# Patient Record
Sex: Female | Born: 1995 | Race: Black or African American | Hispanic: No | Marital: Single | Smoking: Former smoker
Health system: Southern US, Community
[De-identification: ages and names within clinical notes are randomized; demographics above are authoritative.]

## PROBLEM LIST (undated history)

## (undated) DIAGNOSIS — J45909 Unspecified asthma, uncomplicated: Secondary | ICD-10-CM

## (undated) HISTORY — PX: TONSILLECTOMY: SUR1361

## (undated) HISTORY — PX: KNEE ARTHROSCOPY: SUR90

---
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
## 2014-02-24 ENCOUNTER — Ambulatory Visit: Payer: Medicaid Other | Attending: Family Medicine | Admitting: Physical Therapy

## 2014-02-24 DIAGNOSIS — M546 Pain in thoracic spine: Secondary | ICD-10-CM | POA: Diagnosis not present

## 2014-02-24 DIAGNOSIS — R5381 Other malaise: Secondary | ICD-10-CM | POA: Diagnosis not present

## 2014-02-24 DIAGNOSIS — M4120 Other idiopathic scoliosis, site unspecified: Secondary | ICD-10-CM | POA: Insufficient documentation

## 2014-02-24 DIAGNOSIS — M545 Low back pain, unspecified: Secondary | ICD-10-CM | POA: Diagnosis not present

## 2014-02-24 DIAGNOSIS — IMO0001 Reserved for inherently not codable concepts without codable children: Secondary | ICD-10-CM | POA: Diagnosis not present

## 2014-02-27 ENCOUNTER — Ambulatory Visit: Payer: Medicaid Other | Admitting: Physical Therapy

## 2014-02-27 DIAGNOSIS — IMO0001 Reserved for inherently not codable concepts without codable children: Secondary | ICD-10-CM | POA: Diagnosis not present

## 2014-03-03 ENCOUNTER — Ambulatory Visit: Payer: Medicaid Other | Admitting: Physical Therapy

## 2014-03-03 DIAGNOSIS — IMO0001 Reserved for inherently not codable concepts without codable children: Secondary | ICD-10-CM | POA: Diagnosis not present

## 2014-03-04 ENCOUNTER — Encounter: Payer: Medicaid Other | Admitting: *Deleted

## 2014-03-06 ENCOUNTER — Encounter: Payer: Medicaid Other | Admitting: Physical Therapy

## 2014-04-18 ENCOUNTER — Inpatient Hospital Stay (HOSPITAL_COMMUNITY)
Admission: AD | Admit: 2014-04-18 | Discharge: 2014-04-19 | Discharge status: Against Medical Advice | Payer: Medicaid Other | Attending: Obstetrics and Gynecology | Admitting: Obstetrics and Gynecology

## 2016-01-17 DIAGNOSIS — J45909 Unspecified asthma, uncomplicated: Secondary | ICD-10-CM | POA: Insufficient documentation

## 2016-01-17 DIAGNOSIS — Z79899 Other long term (current) drug therapy: Secondary | ICD-10-CM | POA: Insufficient documentation

## 2016-01-17 DIAGNOSIS — Z4801 Encounter for change or removal of surgical wound dressing: Secondary | ICD-10-CM | POA: Insufficient documentation

## 2016-01-17 DIAGNOSIS — Z791 Long term (current) use of non-steroidal anti-inflammatories (NSAID): Secondary | ICD-10-CM | POA: Insufficient documentation

## 2016-01-18 ENCOUNTER — Encounter (HOSPITAL_COMMUNITY): Payer: Self-pay | Admitting: Emergency Medicine

## 2016-01-18 ENCOUNTER — Emergency Department (HOSPITAL_COMMUNITY)
Admission: EM | Admit: 2016-01-18 | Discharge: 2016-01-18 | Disposition: A | Discharge status: Home / Self Care | Payer: Medicaid Other | Attending: Emergency Medicine | Admitting: Emergency Medicine

## 2016-01-18 DIAGNOSIS — Z5189 Encounter for other specified aftercare: Secondary | ICD-10-CM

## 2016-01-18 NOTE — ED Provider Notes (Signed)
CSN: [id number]     Arrival date & time 01/17/16  2359 History  By signing my name below, I, Lauren Reilly, attest that this documentation has been prepared under the direction and in the presence of Lauren Bale, MD. Electronically Signed: Bethel Reilly, ED Scribe. 01/18/2016. 12:45 AM   Chief Complaint  Patient presents with  . Wound Infection   The history is provided by the patient. No language interpreter was used.  Lauren Reilly is a 20 y.o. female who presents to the Emergency Department complaining of recurrent mild pain and bleeding at the site of a umbilical piercing with onset last night. Pt states that her bellybutton ring was caught on a car window when she reached in to grab something. The area was painful after the incident but had improved before returning last night. She had the piercing performed in February of this year and removed it before coming to the ED.   Past Medical History  Diagnosis Date  . Asthma    Past Surgical History  Procedure Laterality Date  . Knee arthroscopy    . Tonsillectomy     History reviewed. No pertinent family history. Social History  Substance Use Topics  . Smoking status: Never Smoker   . Smokeless tobacco: None  . Alcohol Use: No   OB History    No data available     Review of Systems  Skin: Positive for wound.  All other systems reviewed and are negative.  Allergies  Pollen extract  Home Medications   Prior to Admission medications   Medication Sig Start Date End Date Taking? Authorizing Provider  albuterol (PROVENTIL HFA;VENTOLIN HFA) 108 (90 BASE) MCG/ACT inhaler Inhale 2 puffs into the lungs every 6 (six) hours as needed. For shortness of breath    Historical Provider, MD  ibuprofen (ADVIL,MOTRIN) 200 MG tablet Take 600 mg by mouth daily as needed. For menstrual pain    Historical Provider, MD  naproxen (NAPROSYN) 500 MG tablet Take 1 tablet (500 mg total) by mouth 2 (two) times daily with a meal. 11/10/13    Felicie Morn, NP  Norgestimate-Ethinyl Estradiol Triphasic (ORTHO TRI-CYCLEN, 28,) 0.18/0.215/0.25 MG-35 MCG tablet Take 1 tablet by mouth every evening.    Historical Provider, MD   BP 115/68 mmHg  Pulse 72  Temp(Src) 98.3 F (36.8 C) (Oral)  Resp 20  Ht 5' 1.5" (1.562 m)  Wt 150 lb (68.04 kg)  BMI 27.89 kg/m2  SpO2 100%  LMP 01/11/2016 Physical Exam  Constitutional: She is oriented to person, place, and time. She appears well-developed and well-nourished.  HENT:  Head: Normocephalic and atraumatic.  Eyes: Conjunctivae and EOM are normal. Pupils are equal, round, and reactive to light.  Neck: Normal range of motion and phonation normal. Neck supple.  Cardiovascular: Normal rate and regular rhythm.   Pulmonary/Chest: Effort normal and breath sounds normal. She exhibits no tenderness.  Abdominal: Soft. She exhibits no distension. There is no tenderness. There is no guarding.  2 small punctures consistent with recent piercing at the area just above the umbilicus  Very mild induration at each of the punctures  Musculoskeletal: Normal range of motion.  Neurological: She is alert and oriented to person, place, and time. She exhibits normal muscle tone.  Skin: Skin is warm and dry.  Psychiatric: She has a normal mood and affect. Her behavior is normal. Judgment and thought content normal.  Nursing note and vitals reviewed.   ED Course  Procedures (including critical care time)  Medications -  No data to display  Patient Vitals for the past 24 hrs:  BP Temp Temp src Pulse Resp SpO2 Height Weight  01/18/16 0014 115/68 mmHg 98.3 F (36.8 C) Oral 72 20 100 % 5' 1.5" (1.562 m) 150 lb (68.04 kg)    At discharge- Reevaluation with update and discussion. After initial assessment and treatment, an updated evaluation reveals no change in clinical status. Findings discussed with patient and mother, all questions were answered. Lauren Reilly L    DIAGNOSTIC STUDIES: Oxygen Saturation is  100% on RA,  normal by my interpretation.    COORDINATION OF CARE: 12:34 AM Discussed treatment plan with pt at bedside and pt agreed to plan.  Labs Review Labs Reviewed - No data to display  Imaging Review No results found.    EKG Interpretation None      MDM   Final diagnoses:  Visit for wound check    Wound from piercing, periumbilical. Mild inflammatory change associated with the punctures. This appears primarily to be a inflammatory process, and there is no indication for cellulitis, abscess or systemic illness. Patient cannot replace her piercing at this time. She was somewhat distraught about that, but seem to understand the importance of avoiding putting the piercing back in.  Nursing Notes Reviewed/ Care Coordinated Applicable Imaging Reviewed Interpretation of Laboratory Data incorporated into ED treatment  The patient appears reasonably screened and/or stabilized for discharge and I doubt any other medical condition or other [facility] requiring further screening, evaluation, or treatment in the ED at this time prior to discharge.  Plan: Home Medications- none; Home Treatments- moist compress 3 times a day until improved; return here if the recommended treatment, does not improve the symptoms; Recommended follow up- PCP, when necessary   I personally performed the services described in this documentation, which was scribed in my presence. The recorded information has been reviewed and is accurate.    Lauren BaleElliott Muhanad Torosyan, MD 01/18/16 [phone]

## 2016-01-18 NOTE — Discharge Instructions (Signed)
The piercing wounds are inflamed from the injury. They should heal with time and treatments of warm compresses 3 times a day for 3- minutes. Take Tylenol or Motrin for pain. Return here or see your doctor for problems,

## 2016-01-18 NOTE — ED Notes (Signed)
Pt states she has been bleeding at bellybutton ring insertion site since she accidentally hit it.

## 2016-04-18 ENCOUNTER — Emergency Department (HOSPITAL_COMMUNITY)
Admission: EM | Admit: 2016-04-18 | Discharge: 2016-04-19 | Disposition: A | Discharge status: Other Acute Inpatient Hospital | Payer: Medicaid Other | Attending: Emergency Medicine | Admitting: Emergency Medicine

## 2016-04-18 ENCOUNTER — Encounter (HOSPITAL_COMMUNITY): Payer: Self-pay | Admitting: *Deleted

## 2016-04-18 DIAGNOSIS — F322 Major depressive disorder, single episode, severe without psychotic features: Secondary | ICD-10-CM | POA: Diagnosis present

## 2016-04-18 DIAGNOSIS — J45909 Unspecified asthma, uncomplicated: Secondary | ICD-10-CM | POA: Insufficient documentation

## 2016-04-18 DIAGNOSIS — F1290 Cannabis use, unspecified, uncomplicated: Secondary | ICD-10-CM | POA: Insufficient documentation

## 2016-04-18 DIAGNOSIS — R45851 Suicidal ideations: Secondary | ICD-10-CM

## 2016-04-18 DIAGNOSIS — Z7951 Long term (current) use of inhaled steroids: Secondary | ICD-10-CM | POA: Insufficient documentation

## 2016-04-18 DIAGNOSIS — Z79899 Other long term (current) drug therapy: Secondary | ICD-10-CM | POA: Insufficient documentation

## 2016-04-18 LAB — CBC
HEMATOCRIT: 35.2 % — AB (ref 36.0–46.0)
Hemoglobin: 11.7 g/dL — ABNORMAL LOW (ref 12.0–15.0)
MCH: 27.7 pg (ref 26.0–34.0)
MCHC: 33.2 g/dL (ref 30.0–36.0)
MCV: 83.2 fL (ref 78.0–100.0)
Platelets: 243 10*3/uL (ref 150–400)
RBC: 4.23 MIL/uL (ref 3.87–5.11)
RDW: 12.8 % (ref 11.5–15.5)
WBC: 9.3 10*3/uL (ref 4.0–10.5)

## 2016-04-18 LAB — COMPREHENSIVE METABOLIC PANEL
ALK PHOS: 68 U/L (ref 38–126)
ALT: 36 U/L (ref 14–54)
AST: 33 U/L (ref 15–41)
Albumin: 4.9 g/dL (ref 3.5–5.0)
Anion gap: 11 (ref 5–15)
BUN: 14 mg/dL (ref 6–20)
CALCIUM: 9.4 mg/dL (ref 8.9–10.3)
CHLORIDE: 105 mmol/L (ref 101–111)
CO2: 23 mmol/L (ref 22–32)
CREATININE: 0.8 mg/dL (ref 0.44–1.00)
GFR calc Af Amer: >60 mL/min (ref >60)
Glucose, Bld: 79 mg/dL (ref 65–99)
Potassium: 3.4 mmol/L — ABNORMAL LOW (ref 3.5–5.1)
Sodium: 139 mmol/L (ref 135–145)
Total Bilirubin: 0.6 mg/dL (ref 0.3–1.2)
Total Protein: 8.5 g/dL — ABNORMAL HIGH (ref 6.5–8.1)

## 2016-04-18 LAB — RAPID URINE DRUG SCREEN, HOSP PERFORMED
Amphetamines: NOT DETECTED
BARBITURATES: NOT DETECTED
Benzodiazepines: NOT DETECTED
Cocaine: NOT DETECTED
Opiates: NOT DETECTED
Tetrahydrocannabinol: POSITIVE — AB

## 2016-04-18 LAB — ETHANOL
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
## 2016-04-19 ENCOUNTER — Inpatient Hospital Stay (HOSPITAL_COMMUNITY)
Admission: AD | Admit: 2016-04-19 | Discharge: 2016-04-28 | DRG: 885 | Disposition: A | Discharge status: Home / Self Care | Payer: No Typology Code available for payment source | Source: Intra-hospital | Attending: Psychiatry | Admitting: Psychiatry

## 2016-04-19 ENCOUNTER — Encounter (HOSPITAL_COMMUNITY): Payer: Self-pay

## 2016-04-19 DIAGNOSIS — G4700 Insomnia, unspecified: Secondary | ICD-10-CM | POA: Diagnosis present

## 2016-04-19 DIAGNOSIS — F411 Generalized anxiety disorder: Secondary | ICD-10-CM | POA: Diagnosis present

## 2016-04-19 DIAGNOSIS — J45909 Unspecified asthma, uncomplicated: Secondary | ICD-10-CM | POA: Diagnosis present

## 2016-04-19 DIAGNOSIS — F332 Major depressive disorder, recurrent severe without psychotic features: Principal | ICD-10-CM | POA: Diagnosis present

## 2016-04-19 DIAGNOSIS — R45851 Suicidal ideations: Secondary | ICD-10-CM | POA: Diagnosis present

## 2016-04-19 DIAGNOSIS — F322 Major depressive disorder, single episode, severe without psychotic features: Secondary | ICD-10-CM | POA: Diagnosis present

## 2016-04-19 DIAGNOSIS — F410 Panic disorder [episodic paroxysmal anxiety] without agoraphobia: Secondary | ICD-10-CM | POA: Diagnosis present

## 2016-04-19 LAB — I-STAT BETA HCG BLOOD, ED (MC, WL, AP ONLY)

## 2016-04-19 LAB — SALICYLATE LEVEL

## 2016-04-19 LAB — ACETAMINOPHEN LEVEL

## 2016-04-19 MED ORDER — FLUOXETINE HCL 10 MG PO CAPS
10.0000 mg | ORAL_CAPSULE | Freq: Every day | ORAL | Status: DC
  Administered 2016-04-19: 10 mg via ORAL
  Filled 2016-04-19: qty 1

## 2016-04-19 MED ORDER — ALUM & MAG HYDROXIDE-SIMETH 200-200-20 MG/5ML PO SUSP: 30.0000 mL | ORAL | Status: DC | PRN

## 2016-04-19 MED ORDER — HYDROXYZINE HCL 25 MG PO TABS: 25.0000 mg | ORAL_TABLET | Freq: Three times a day (TID) | ORAL | Status: DC | PRN

## 2016-04-19 MED ORDER — MAGNESIUM HYDROXIDE 400 MG/5ML PO SUSP: 30.0000 mL | Freq: Every day | ORAL | Status: DC | PRN

## 2016-04-19 MED ORDER — ACETAMINOPHEN 325 MG PO TABS
650.0000 mg | ORAL_TABLET | Freq: Four times a day (QID) | ORAL | Status: DC | PRN
  Administered 2016-04-24 – 2016-04-28 (×3): 650 mg via ORAL
  Filled 2016-04-19 (×3): qty 2

## 2016-04-19 MED ORDER — TRAZODONE HCL 50 MG PO TABS: 50.0000 mg | ORAL_TABLET | Freq: Every day | ORAL | Status: DC

## 2016-04-19 NOTE — BH Assessment (Signed)
Consulted with Maryjean Mornharles Kober, PA-C who recommends overnight observation with a re-evaluation in the morning.   Davina PokeJoVea Shadeed Colberg, LCSW Therapeutic Triage Specialist [facility] 04/19/2016 3:59 AM
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
## 2016-04-19 NOTE — Tx Team (Signed)
Initial Interdisciplinary Treatment Plan   PATIENT STRESSORS: Educational concerns Marital or family conflict   PATIENT STRENGTHS: Wellsite geologistCommunication skills General fund of knowledge Motivation for treatment/growth Physical Health   PROBLEM LIST: Problem List/Patient Goals Date to be addressed Date deferred Reason deferred Estimated date of resolution  Depression 04/19/16     Suicidal ideation 04/19/16     History of trauma 04/19/16     "I would like to be happy with myself" 04/19/16     "Finding a better outlet then screaming and yelling" 04/19/16                              DISCHARGE CRITERIA:  Improved stabilization in mood, thinking, and/or behavior Verbal commitment to aftercare and medication compliance  PRELIMINARY DISCHARGE PLAN: Outpatient therapy Medication management  PATIENT/FAMIILY INVOLVEMENT: This treatment plan has been presented to and reviewed with the patient, [employer] Timken CompanyShaniece N Costlow.  The patient and family have been given the opportunity to ask questions and make suggestions.  Norm ParcelHeather V Meyer Dockery 04/19/2016, 5:41 PM
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
## 2016-04-19 NOTE — ED Notes (Signed)
Bed: WA27 Expected date:  Expected time:  Means of arrival:  Comments: Hold for room  4

## 2016-04-19 NOTE — Progress Notes (Signed)
D:  Pt is on unit in room.  Pt is animated, friendly and joking.  Pt has complaint for skin rash and mom has brought in sme ointment. A:  Pt asking to go to storage unit and retreive her car keys, cell phone and some cash to give her mom.  Explained that there is no order for ointment but will pass on to day shift nurse in am. R: Pt verbalizes understanding.  Pt remains safe on unit.

## 2016-04-19 NOTE — ED Notes (Signed)
Bed: WA04 Expected date:  Expected time:  Means of arrival:  Comments: 

## 2016-04-19 NOTE — Progress Notes (Signed)
Not[facility]ill[facility]bedLindsay, [facility] that we needed orders on Areya.  She notified Renata Capriceonrad NP requesting orders.
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
## 2016-04-19 NOTE — BH Assessment (Signed)
[facility]     04/19/16: Patient meets criteria inpatient criteria [facility] 400 hall recommended. 407-2 paperwork completed.

## 2016-04-20 DIAGNOSIS — F332 Major depressive disorder, recurrent severe without psychotic features: Principal | ICD-10-CM

## 2016-04-20 LAB — TSH: TSH: 0.805 u[IU]/mL (ref 0.350–4.500)

## 2016-04-20 MED ORDER — HYDROXYZINE HCL 25 MG PO TABS
25.0000 mg | ORAL_TABLET | Freq: Four times a day (QID) | ORAL | Status: DC | PRN
  Administered 2016-04-22 – 2016-04-28 (×8): 25 mg via ORAL
  Filled 2016-04-20: qty 1
  Filled 2016-04-20: qty 10
  Filled 2016-04-20 (×7): qty 1

## 2016-04-20 MED ORDER — CITALOPRAM HYDROBROMIDE 10 MG PO TABS
10.0000 mg | ORAL_TABLET | Freq: Every day | ORAL | Status: DC
  Administered 2016-04-20 – 2016-04-25 (×6): 10 mg via ORAL
  Filled 2016-04-20 (×9): qty 1

## 2016-04-20 MED ORDER — CLOTRIMAZOLE-BETAMETHASONE 1-0.05 % EX CREA
TOPICAL_CREAM | Freq: Two times a day (BID) | CUTANEOUS | Status: DC
  Administered 2016-04-20 – 2016-04-22 (×4): via TOPICAL
  Administered 2016-04-23: 1 via TOPICAL
  Administered 2016-04-23 – 2016-04-28 (×7): via TOPICAL

## 2016-04-20 NOTE — [facility] Group Notes (Signed)
   [facility]  04/20/2016  8:45 AM   Participation Quality: Alert, Appropriate and Oriented  Mood/Affect: Appropriate  Depression Rating: 3  Anxiety Rating: reports little to no anxiety   Thoughts of Suicide: Pt denies SI/HI  Will you contract for safety? Yes  Current AVH: Pt denies  Plan for Discharge/Comments: Pt attended discharge planning group and actively participated in group. CSW provided pt with today's workbook. Patient is unsure if she will have a place to live at discharge. She would like a referral for outpatient services.   Transportation Means: CSW continuing to assess  Supports: No supports mentioned at this time  Samuella BruinKristin Seylah Wernert, MSW, Johnson & JohnsonLCSW Clinical Social Worker [facility] [phone]

## 2016-04-20 NOTE — [facility] Suicide Risk Assessment (Signed)
[facility]   Nursing information obtained from:  Patient Demographic factors:  Adolescent or young adult, Unemployed Current Mental Status:  NA Loss Factors:  Decrease in vocational status, Loss of significant relationship, Financial problems / change in socioeconomic status Historical Factors:  Impulsivity Risk Reduction Factors:  Sense of responsibility to family  Total Time spent with patient: 45 minutes Principal Problem: MDD  Diagnosis:   Patient Active Problem List   Diagnosis Date Noted  . Severe major depression, single episode, without psychotic features (HCC) [F32.2] 04/19/2016  . MDD (major depressive disorder), recurrent severe, without psychosis (HCC) [F33.2] 04/19/2016     Continued Clinical Symptoms:  Alcohol Use Disorder Identification Test Final Score (AUDIT): 1 The "Alcohol Use Disorders Identification Test", Guidelines for Use in Primary Care, Second Edition.  World Science writerHealth Organization [facility](WHO). Score between 0-7:  no or low risk or alcohol related problems. Score between 8-15:  moderate risk of alcohol related problems. Score between 16-19:  high risk of alcohol related problems. Score 20 or above:  warrants further diagnostic evaluation for alcohol dependence and treatment.   CLINICAL FACTORS:  20 year old single female, presents with symptoms of depression, suicidal ideations, worsened by recent argument with boyfriend      Psychiatric Specialty Exam: Physical Exam  ROS  Blood pressure 103/59, pulse 90, temperature 98.5 F (36.9 C), temperature source Oral, resp. rate 16, height 5' 1.5" (1.562 m), weight 156 lb (70.761 kg), last menstrual period 03/19/2016, SpO2 100 %.Body mass index is 29 kg/(m^2).   see admit note MSE    COGNITIVE FEATURES THAT CONTRIBUTE TO RISK:  Closed-mindedness and Loss of executive function    SUICIDE RISK:   Moderate:  Frequent suicidal ideation with limited intensity, and duration, some specificity  in terms of plans, no associated intent, good self-control, limited dysphoria/symptomatology, some risk factors present, and identifiable protective factors, including available and accessible social support.  PLAN OF CARE: Patient will be admitted to inpatient psychiatric unit for stabilization and safety. Will provide and encourage milieu participation. Provide medication management and maked adjustments as needed.  Will follow daily.    I certify that inpatient services furnished can reasonably be expected to improve the patient's condition.   Nehemiah MassedOBOS, FERNANDO, MD 04/20/2016, 2:03 PM

## 2016-04-20 NOTE — Progress Notes (Signed)
Patient ID: Lauren Reilly, female   DOB: 05/07/1996, 20 y.o.   MRN: [id number]   Pt currently presents with a flat affect and labile behavior. Pt mood is labile, easily angered when patient requests are denied. Per self inventory, pt rates depression at a 1, hopelessness 0 and anxiety 0. Pt's daily goal is to "find a better way to let out my anger and frustration" and they intend to do so by "listen and pay close attention." Pt reports poor sleep, a good appetite, low energy and poor concentration. Pt reports itching patches of eczema, made worse by recent sweating.   Pt provided with medications per providers orders. Pt's labs and vitals were monitored throughout the day. Pt supported emotionally and encouraged to express concerns and questions. Pt educated on medications. PTA cream verified by pharmacist, se MAR.   Pt's safety ensured with 15 minute and environmental checks. Pt currently denies SI/HI and A/V hallucinations. Pt verbally agrees to seek staff if SI/HI or A/VH occurs and to consult with staff before acting on any harmful thoughts. Will continue POC.

## 2016-04-20 NOTE — Progress Notes (Signed)
D: Pt was in the dayroom upon initial approach.  Pt has appropriate affect and depressed mood.  Her goal today was "to find a better way to express myself when I get upset."  Pt reports she was talking to her aunt and she became more depressed earlier today.  Pt reports she started coloring and "it soothes me, it brought my depression down from a 6 to a 2."  Pt denies SI/HI, denies hallucinations, denies pain.  Pt has been visible in milieu interacting with peers and staff appropriately.     A: Introduced self to pt.  Met with pt and offered support and encouragement.  Actively listened to pt.   R: Pt verbally contracts for safety.  She reports she will inform staff of needs and concerns.  Will continue to monitor and assess.
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
## 2016-04-20 NOTE — Progress Notes (Signed)
Recreation Therapy Notes  Date: 07.05.2017 Time: 9:30am Location: [address] Group Room   Group Topic: Stress Management  Goal Area(s) Addresses:  Patient will actively participate in stress management techniques presented during session.   Behavioral Response: Engaged, Attentive   Intervention: Stress management techniques  Activity :  Deep Breathing and Tapping. LRT provided education, instruction and demonstration on practice of Deep Breathing and Tapping. Patient was asked to participate in technique introduced during session.   Education:  Stress Management, Discharge Planning.   Education Outcome: Acknowledges education  Clinical Observations/Feedback: Patient actively engaged in technique introduced, expressed no concerns and demonstrated ability to practice independently post d/c.   Lauren Reilly Lauren Reilly, LRT/CTRS        Lauren Reilly Reilly 04/20/2016 12:00 PM 
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
## 2016-04-21 MED ORDER — CLOTRIMAZOLE 1 % EX CREA
TOPICAL_CREAM | CUTANEOUS | Status: AC
  Administered 2016-04-21: 17:00:00
  Filled 2016-04-21: qty 15
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
## 2016-04-21 NOTE — [facility] Group Notes (Signed)
[facility] LCSW Group Therapy 04/21/2016 1:15 PM Type of Therapy: Group Therapy Participation Level: Active  Participation Quality: Attentive, Sharing and Supportive  Affect: Appropriate  Cognitive: Alert and Oriented  Insight: Developing/Improving and Engaged  Engagement in Therapy: Developing/Improving and Engaged  Modes of Intervention: Activity, Clarification, Confrontation, Discussion, Education, Exploration, Limit-setting, Orientation, Problem-solving, Rapport Building, Reality Testing, Socialization and Support  Summary of Progress/Problems: Patient was attentive and engaged with speaker from [employer]. Patient was attentive to speaker while they shared their story of dealing with mental health and overcoming it. Patient expressed interest in their programs and services and received information on their agency. Patient processed ways they can relate to the speaker.   Jasie Meleski, LCSW Clinical Social Worker [facility] [phone]   
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
## 2016-04-21 NOTE — B[facility] Group Notes (Signed)
Late Entry from 7/5:      [facility] 04/20/16  1:15 PM   Type of Therapy: Group Therapy  Participation Level: Did Not Participate as she was meeting with provider. Patient returned at the end of group.   Samuella BruinKristin Ludy Messamore, MSW, LCSW Clinical Social Worker [facility] [phone]

## 2016-04-22 NOTE — Plan of Care (Signed)
Problem: Activity: Goal: Sleeping patterns will improve Outcome: Progressing Pt slept 6.75 hours for the past two nights according to flowsheets.

## 2016-04-22 NOTE — Progress Notes (Signed)
D: Pt was in the dayroom upon initial approach.  Pt has appropriate affect and pleasant mood.  When asked about her day, pt states "it was okay, my auntie just left.  We had a good talk, but it was an emotional talk."  Pt reports "I did accomplish my goal today.  It was to build a better relationship with family."  Pt's mother also came to visit tonight.  She reports it was a positive visit and "she's concerned.  She wants family therapy."  Pt discussed how writing in her journal is a positive coping skill for her.  She reports she reflects on "words of wisdom" she has written in the back of her journal.  Pt denies SI/HI, denies hallucinations, denies pain.  Pt has been visible in milieu interacting with peers and staff appropriately.  She has been positively encouraging peers.  Pt attended evening karaoke group and sang two songs.  A: Met with pt and offered support and encouragement.  Actively listened to pt.  Positive coping skills encouraged and reinforced. R: Pt verbally contracts for safety.  She reports she will inform staff of needs and concerns.  Will continue to monitor and assess.

## 2016-04-22 NOTE — Progress Notes (Signed)
Recreation Therapy Notes  Date: 07.07.2017 Time: 9:30am Location: [address] Group Room   Group Topic: Stress Management  Goal Area(s) Addresses:  Patient will actively participate in stress management techniques presented during session.   Behavioral Response: Did not attend.   Trino Higinbotham L Pakou Rainbow, LRT/CTRS        Alleta Avery L 04/22/2016 11:45 AM 

## 2016-04-22 NOTE — [facility] Group Notes (Signed)
   [facility]  04/22/2016  8:45 AM   Participation Quality: Alert, Appropriate and Oriented  Mood/Affect: Appropriate  Depression Rating: reports little to no depression today  Anxiety Rating: reports mild anxiety  Thoughts of Suicide: Pt denies SI/HI  Will you contract for safety? Yes  Current AVH: Pt denies  Plan for Discharge/Comments: Pt attended discharge planning group and actively participated in group. CSW provided pt with today's workbook. Patient plans to return home with family to follow up with outpatient services.   Transportation Means: Pt reports access to transportation  Supports: No supports mentioned at this time  Lauren Reilly, MSW, Johnson & JohnsonLCSW Clinical Social Worker [facility] [phone]

## 2016-04-22 NOTE — Progress Notes (Signed)
Adult Psychoeducational Group Note  Date:  04/22/2016 Time:  9:50 PM  Group Topic/Focus:  Wrap-Up Group:   The focus of this group is to help patients review their daily goal of treatment and discuss progress on daily workbooks.  Participation Level:  Active  Participation Quality:  Appropriate  Affect:  Anxious  Cognitive:  Appropriate  Insight: Good  Engagement in Group:  Engaged  Modes of Intervention:  Activity  Additional Comments:  Patient rated her day a 3. Stated she did not have a good day because she had a panic attack. Goal is to find new coping mechanisms to not panic.  Natasha MeadKiara M Nash[place] Duquette 04/22/2016, 9:50 PM
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
## 2016-04-22 NOTE — Progress Notes (Signed)
D:  Patient's self inventory sheet, patient sleeps good, no sleep medication given.  Good appetite, normal energy level, good concentration.  Rated depression 1, denied hopeless and anxiety.  Denied withdrawals.  Denied SI.  Denied physial problems.  Denied pain.  Goal is to learn a new way to deal with everything.  Plans to try new activiies.  Patient stated she tries to repress thoughts/feelings which depress her.  Hard to talk about problems which upset her.  Does have discharge plans. A:  Medications administered per MD orders.  Emotional support and encouragement given patient. R:  Denied SI and HI, contracts for safety.  Denied A/V hallucinations.  Safety maintained with 15 minute checks.

## 2016-04-22 NOTE — BHH Suicide Risk Assessment (Signed)
BHH INPATIENT:  Family/Significant Other Suicide Prevention Education  Suicide Prevention Education:  Education Completed; mother [name] [phone],  (name of family member/significant other) has been identified by the patient as the family member/significant other with whom the patient will be residing, and identified as the person(s) who will aid the patient in the event of a mental health crisis (suicidal ideations/suicide attempt).  With written consent from the patient, the family member/significant other has been provided the following suicide prevention education, prior to the and/or following the discharge of the patient.  The suicide prevention education provided includes the following:  Suicide risk factors  Suicide prevention and interventions  National Suicide Hotline telephone number  [facility] assessment telephone number  [facility] Emergency Assistance 911  [facility] and/or Residential Mobile Crisis Unit telephone number  Request made of family/significant other to:  Remove weapons (e.g., guns, rifles, knives), all items previously/currently identified as safety concern.    Remove drugs/medications (over-the-counter, prescriptions, illicit drugs), all items previously/currently identified as a safety concern.  The family member/significant other verbalizes understanding of the suicide prevention education information provided.  The family member/significant other agrees to remove the items of safety concern listed above.  Lauren Reilly, Lauren Reilly 04/22/2016, 3:25 PM
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
## 2016-04-22 NOTE — Progress Notes (Signed)
  [facility] :  Will you be returning to the same living situation after discharge:  Yes,  patient plans to return home At discharge, do you have transportation home?: Yes,  family Do you have the ability to pay for your medications: Yes,  patient will be provided with prescriptions at discharge  Release of information consent forms completed and in the chart;  Patient's signature needed at discharge.  Patient to Follow up at: Follow-up Information    Follow up with [facility].   Why:  Referral for therapy and medication management services made on 7/6. Office staff should contact you on Tuesday 7/11 to schedule assessment. If you do not hear from staff by Wed. 7/12, call office directly.    Contact information:   [address], [address]  [place], [place]  [address]  Ph:  [phone]   (fax) [phone]      Please follow up.   Why:  Bring ID, social security card, and any proof of income to assessment.      Next level of care provider has access to [facility] Link:no  Safety Planning and Suicide Prevention discussed: Yes,  with patient and mother  Have you used any form of tobacco in the last 30 days? (Cigarettes, Smokeless Tobacco, Cigars, and/or Pipes): Yes  Has patient been referred to the Quitline?: Patient refused referral  Patient has been referred for addiction treatment: Yes  Anjoli Diemer, [place]boKristin L 04/22/2016, 4:27 PM

## 2016-04-22 NOTE — Plan of Care (Signed)
Problem: Education: Goal: Utilization of techniques to improve thought processes will improve Outcome: Progressing Nurse discussed depression/coping skills with patient.    

## 2016-04-22 NOTE — Progress Notes (Signed)
D: Pt was in the dayroom with visitors upon initial approach.  She introduced Clinical research associatewriter to visitors and then returned to her visit.  Pt has appropriate affect and anxious mood.  When asked what her goal is, pt states "to use the skills I'm learning."  Pt reports "I can't continue to hold things in and bottle them up.  That's what got me here."  Pt reports having a good visit with her aunt and "god mommy."  Reports having a hard time earlier today because "I thought I was going to have to discharge to my mom's house and my grandma is there."  Pt reports feeling this would be an environment of "toxicity" because she and her grandmother do not get along.  Pt reports she may be discharging Monday and other aftercare plans are being considered.  Pt denies SI/HI, denies hallucinations, denies pain.  Pt has been visible in milieu interacting with peers and staff appropriately.  Pt attended evening group.   A: Actively listened to pt and offered support and encouragement.  Positive coping skills encouraged and reinforced.   R: Pt discussed some of the coping skills she has including: "tapping, deep-breathing, writing in my journal, coloring."  Pt verbally contracts for safety.  Reports she will inform staff of needs and concerns.  Will continue to monitor and assess.

## 2016-04-23 NOTE — Progress Notes (Signed)
Writer has observed patient up the dayroom this evening interacting appropriately with peers. She attended group this evening and was later seen in the dayroom coloring. She denies si/hi/a/v hallucinations. Support given and safety maintained on unit with 15 min checks,

## 2016-04-23 NOTE — [facility] Group Notes (Addendum)
The focus of this group is to educate the patient on the purpose and policies of crisis stabilization and provide a format to answer questions about their admission.  The group details unit policies and expectations of patients while admitted.  Patient attended 0900 nurse education orientation group this morning.  Patient actively participated and had appropriate affect.  Patient was alert.  Patent had appropriate insight and appropriate engagement.  Today patient will work on 3 goals for discharge.   

## 2016-04-23 NOTE — [facility] Counselor (Signed)
[facility] LCSW Group Therapy Note  04/23/2016 10:15 - 11:15 AM  Type of Therapy and Topic:  Group Therapy: Avoiding Self-Sabotaging and Enabling Behaviors  Participation Level:  Active  Participation Quality:  Appropriate  Affect:  Appropriate  Cognitive:  Alert and Oriented  Insight:  Developing/Improving  Engagement in Therapy:  Developing/Improving   Therapeutic models used Cognitive Behavioral Therapy Person-Centered Therapy Motivational Interviewing   Summary of Patient Progress: The main focus of today's process group was for the patient to identify ways in which they have in the past sabotaged their own recovery. Motivational Interviewing was utilized to ask the group members what they get out of their self sabotaging behavior(s), and what reasons they may have for wanting to change. The Stages of Change were explained using a handout, and patients identified where they currently are with regard to stages of change. Yamilette reports she has made a decision to change and realizes she will need to work on spending less time in isolation and negative self talk.    Carney Bernatherine C Seth Higginbotham, LCSW
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
## 2016-04-23 NOTE — Progress Notes (Signed)
Adult Psychoeducational Group Note  Date:  04/23/2016 Time:  10:17 PM  Group Topic/Focus:  Wrap-Up Group:   The focus of this group is to help patients review their daily goal of treatment and discuss progress on daily workbooks.  Participation Level:  Active  Participation Quality:  Appropriate and Supportive  Affect:  Anxious and Appropriate  Cognitive:  Alert, Appropriate and Oriented  Insight: Appropriate  Engagement in Group:  Engaged and Supportive  Modes of Intervention:  Discussion and Support  Additional Comments:  Patient articulated that her day was a 9. She stayed positive and participated in groups and activities during the day.  She decided to use coloring, good communication and meditation as her coping skills.   Rayne Loiseau W Meilyn Heindl 04/23/2016, 10:17 PM

## 2016-04-24 NOTE — Plan of Care (Signed)
Problem: Education: Goal: Knowledge of the prescribed therapeutic regimen will improve Outcome: Progressing Nurse discussed depression/anxiety/coping skills with patient.    

## 2016-04-24 NOTE — Progress Notes (Signed)
Close Observation Note: 1715  Patient has been sleeping in chair in the dayroom with close observation.  No signs/symptoms of pain/distress noted on patient's face/body movements.  Respirations even and unlabored. Close observation continues per MD order.

## 2016-04-24 NOTE — Progress Notes (Addendum)
Close observation Note: 1445  Patient very upset over discharge, thought she would be discharged this afternoon but will be discharged tomorrow morning (Monday 04/25/2016 early) per NP.  Patient told nurse that she was ready to be discharged, does not want to return to grandmother's home, mother is going out of town.  Homeless shelter information was given to patient to call for assistance.  Patient has not made any of these calls as of this time.  Patient told nurse that she was very upset and had rubbed her hand against the side of her furniture trying to hurt herself.  NP informed and nurse was told to put patient on close obs, patient change into scrubs, all belongings in her room were placed in lockers which will be returned to patient at discharge.  [name]  Patient will be sleeping in quiet room tonight.  Respirations even and unlabored.  No signs/symptoms of pain/distress noted on patient's face/body movements.  Close obs to continue per MD order for safety.

## 2016-04-24 NOTE — Progress Notes (Signed)
Close Observation Note: 1845  Patient has been sleeping on chair in dayroom.  Respirations even and unlabored.  No signs/symptoms of pain/distress noted on patient's face/body movements.  Close observation continues for patient's safety per MD order.

## 2016-04-24 NOTE — [facility] Group Notes (Signed)
[facility] LCSW Group Therapy  04/24/2016 10:00 AM  Type of Therapy:  Group Therapy and Activity to Identify signs of Improvement or Decompensation   Participation Level:  Did Not Attend; patient was seen in her room in bed and encouraged to attend yet was a no show.   Carney Bernatherine C Harrill, LCSW

## 2016-04-24 NOTE — Progress Notes (Signed)
D: Pt was in the hallway upon initial approach.  Pt has anxious, depressed affect and mood.  She reports feeling "a lot better than earlier."  Pt denies SI/HI, denies hallucinations, denies pain.  Pt has been visible in milieu interacting with peers and staff appropriately.  Pt attended evening group.   A: Actively listened to pt and offered support and encouragement.  PRN medication administered for anxiety.  On-call provider notified that pt verbally contracts for safety tonight.  Close observation discontinued per on-call provider order.  Pt's belongings are to remain in locker and pt is to remain in paper scrubs per on-call provider order.   R: Pt is compliant with medication.  Pt verbally contracts for safety.  She reports she will inform staff of needs and concerns.  Will continue to monitor and assess.

## 2016-04-24 NOTE — [facility] Group Notes (Signed)
The focus of this group is to educate the patient on the purpose and policies of crisis stabilization and provide a format to answer questions about their admission.  The group details unit policies and expectations of patients while admitted.  Patient did not attend 0900 nurse education orientation group this morning.  Patient talked to MD.

## 2016-04-24 NOTE — Progress Notes (Signed)
D:  Patient's self inventory sheet, patient sleeps good, no sleep medication given.  Good appetite, normal energy level, good concentration.  Rated depression 1, denied hopeless, anxiety 3.  Denied withdrawals.  Denied SI.  Denied physical problems. Denied pain.  Goal is to work on positive thinking and change mind set.  Plans to write 10 positive things about herself.  Does have discharge plans. A:  Medications administered per MD order.  Emotional support and encouragement given patient. R:  Denied SI and HI, contracts for safety.  Denied A/V hallucinations.  Safety maintained with 15 minute checks.
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
## 2016-04-24 NOTE — [facility] Counselor (Signed)
CSW and Berneta Levins NP met with patient's mother in [facility] Conference room after verifying written permission from patient and obtaining verbal permission. Mother presents with similar concern as patient who was to go to aunt's home today at discharge. Due to aunts unexpected business trip she cannot go there today. Mother believes as does patient it would not be in patient's best interest to DC home today. Both patient and mother were given a list of shelters in area and encouraged to contact them. Mother states she will make some calls; patient remained in bed during 10 group therapy session.Patient was provided of shelter contacts and encouraged to make calls at 11:40 AM  Lauren Pigeon, LCSW

## 2016-04-25 MED ORDER — CITALOPRAM HYDROBROMIDE 20 MG PO TABS
20.0000 mg | ORAL_TABLET | Freq: Every day | ORAL | Status: DC
  Administered 2016-04-26 – 2016-04-28 (×3): 20 mg via ORAL
  Filled 2016-04-25 (×4): qty 1
  Filled 2016-04-25: qty 7

## 2016-04-25 NOTE — Progress Notes (Signed)
Recreation Therapy Notes   Date: 07.10.2017 Time: 9:30am  Location: [address] Group Room   Group Topic: Stress Management  Goal Area(s) Addresses:  Patient will actively participate in stress management techniques presented during session.   Behavioral Response: Did not attend.   Lauren Reilly, LRT/CTRS        Tayden Duran L 04/25/2016 10:22 AM 
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
## 2016-04-25 NOTE — Progress Notes (Signed)
The focus of this group is to help patients review their daily goal of treatment and discuss progress on daily workbooks.   Patient attended group and participated. Patient states that her goal for today was to maintain positive attitude. She states that her mom, aunt and godmother came to visit her and they kept her in a good mood. She states that she rated her day as a 8 out of 10

## 2016-04-25 NOTE — Progress Notes (Signed)
Psychoeducational Group Note  Date:  04/25/2016 Time:  1015  Group Topic/Focus:  Self Care:   The focus of this group is to help patients understand the importance of self-care in order to improve or restore emotional, physical, spiritual, interpersonal, and financial health.  Participation Level: Did Not Attend  Participation Quality:  Not Applicable  Affect:  Not Applicable  Cognitive:  Not Applicable  Insight:  Not Applicable  Engagement in Group: Not Applicable  Additional Comments:    Maretta Losli, Ebone Alcivar Patience 04/25/2016, 3:24 PM
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
## 2016-04-25 NOTE — Progress Notes (Signed)
D: Pt presents animated on approach. Pt rates decreasing depression 4/10. Hopelessness 2/10. Anxiety 3/10. Pt denies suicidal thoughts. Pt verbalized concerns about discharging home. Pt stated that her family is tying to find her a safe place to discharge to. Pt stated that she cannot return back to her grandmother's house because she will fall back into depression.  A: Medications reviewed with pt. Medications administered as ordered per MD. Verbal support provided. Pt encouraged to attend groups. 15 minute checks performed for safety.  R: Pt receptive to tx.

## 2016-04-26 NOTE — Progress Notes (Signed)
MD requested nurse to return 4 bags of belongings that were removed from her room when she was suicidal.  Patient has improved and nurse returned 4 bags of belongings to patient.  Patient was very happy and smiling. Patient stated she is feeling much better now, denied SI thoughts.  Respirations even and unlabored.  No signs/symptoms of pain/distress noted on patient's face/body movements.  Safety maintained with 15 minute checks.

## 2016-04-26 NOTE — Progress Notes (Signed)
D: Pt was in the dayroom upon initial approach.  Pt has anxious affect and mood.  Her goal is to "be safe."  She reports she is working on her discharge plan.  Rep[facility], I feel like it's a good thing, it's what I need but it does make me anxious because I've never been there and I don't know anyone there."  Pt denies SI/HI, denies hallucinations, denies pain.  Pt has been visible in milieu interacting with peers and staff appropriately.  Pt attended evening group.   A: Actively listened to pt and offered support and encouragement.  PRN medication administered for anxiety. R: Pt is compliant with medication.  Pt verbally contracts for safety.  Will continue to monitor and assess.

## 2016-04-26 NOTE — Progress Notes (Signed)
Patient ID: Lauren Reilly, female   DOB: Feb 20, 1996, 20 y.o.   MRN: [id number] D   ---   Pt. Agrees to contract for safety and  Denies pain at this time.   She has minimal conversation with staff and minimal eye contact.m  She does smile on approach and accepts medications as asked.  She did not attend group this AM and has no goal for today.  Pt. Appears depressed and withdrawn. She remains  in scrubs on the unit and prefers to stay in her room.   Pt. Takes medications  As asked with no sign of adverse effects.  --- A ---  Support and encouragement provided  --   A ---   Pt. Remain safe on unit

## 2016-04-26 NOTE — Progress Notes (Signed)
D: Pt endorsed mild depression; states, "My anxiety has gone; they gave me that medicine that starts with a "V"; I will rate my depression to be about 2." Pt however denied SI, pain, HI or AVH. Pt was observed having appropriate conversation with peers. Pt is flat however remained calm and cooperative. A: Medications offered as prescribed.  Support, encouragement, and safe environment provided.  15-minute safety checks continue. R: Pt was med compliant. Pt attended wrap-up group. Safety checks continue.
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
## 2016-04-26 NOTE — Progress Notes (Signed)
The focus of this group is to help patients review their daily goal of treatment and discuss progress on daily workbooks.  Patient was present in group and participated. Patient states that her goals were to work on finding a safe place and to write down 10 positive things about herself. Patient states that she has a bubbly personality and that she likes to make people happy. She states that her day was an 8 out of 10
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
## 2016-04-27 NOTE — Progress Notes (Signed)
Recreation Therapy Notes  Date: 07.12.2017 Time: 9:30am Location: [address] Group Room   Group Topic: Stress Management  Goal Area(s) Addresses:  Patient will actively participate in stress management techniques presented during session.   Behavioral Response: Engaged, Attentive   Intervention: Stress management techniques  Activity :  Deep Breathing and Guided Imagery. LRT provided education, instruction and demonstration on practice of Deep Breathing and Guided Imagery. Patient was asked to participate in technique introduced during session.   Education:  Stress Management, Discharge Planning.   Education Outcome: Acknowledges education  Clinical Observations/Feedback: Patient actively engaged in technique introduced, expressed no concerns and demonstrated ability to practice independently post d/c.   Marykay Lexenise L Jury Caserta, LRT/CTRS        Jearl KlinefelterBlanchfield, Squire Withey L 04/27/2016 12:06 PM

## 2016-04-27 NOTE — Progress Notes (Signed)
The focus of this group is to help patients review their daily goal of treatment and discuss progress on daily workbooks.  Patient attended group and participated. Patient states that her goal for the day was to not allow situations to overwhelm her. Patient states that she has learned that she has to leave situations and then come back to them. Patient rated her day as a 8 out of 10.
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
## 2016-04-27 NOTE — Progress Notes (Signed)
D: Pt was in the dayroom upon initial approach.  Pt has anxious affect and mood.  Reports her goal today was to "list 10 positive things about myself."  Reports she met her goal.  She states I'm prepping for discharge, I may go tomorrow but I'm gonna have to find my own way there."  Pt denies SI/HI, denies hallucinations, reports right hand pain of 4/10.  PRN medication offered for pain, pt refused.  Pt has been visible in milieu interacting with peers and staff appropriately.  Pt attended evening group.   A: Actively listened to pt and offered support and encouragement.   PRN medication administered for anxiety.  Positive coping skills encouraged and reinforced. R: Pt is compliant with medications.  Pt verbally contracts for safety.  Will continue to monitor and assess.

## 2016-04-28 MED ORDER — CITALOPRAM HYDROBROMIDE 20 MG PO TABS: 20.0000 mg | ORAL_TABLET | Freq: Every day | ORAL | Status: DC

## 2016-04-28 MED ORDER — HYDROXYZINE HCL 25 MG PO TABS: 25.0000 mg | ORAL_TABLET | Freq: Four times a day (QID) | ORAL | Status: DC | PRN

## 2016-04-28 NOTE — Discharge Summary (Signed)
Physician Discharge Summary Note  Patient:  Lauren Reilly is an 20 y.o., female MRN:  [id number] DOB:  21-Feb-1996 Patient phone:  [phone] (home)  Patient address:   [address] [place] [address],  Total Time spent with patient: 30 minutes  Date of Admission:  04/19/2016 Date of Discharge: 04/28/2016  Reason for Admission:  Developed suicidal thoughts after having argument with her boyfriend.  Principal Problem: MDD (major depressive disorder), recurrent severe, without psychosis [facility]) Discharge Diagnoses: Patient Active Problem List   Diagnosis Date Noted  . MDD (major depressive disorder), recurrent severe, without psychosis (HCC) [F33.2] 04/19/2016    Priority: High  . Severe major depression, single episode, without psychotic features (HCC) [F32.2] 04/19/2016   Past Psychiatric History: see HPI  Past Medical History:  Past Medical History  Diagnosis Date  . Asthma     Past Surgical History  Procedure Laterality Date  . Knee arthroscopy    . Tonsillectomy     Family History: History reviewed. No pertinent family history. Family Psychiatric  History: see HPI Social History:  History  Alcohol Use No     History  Drug Use  . Yes  . Special: Marijuana    Social History   Social History  . Marital Status: Single    Spouse Name: N/A  . Number of Children: N/A  . Years of Education: N/A   Social History Main Topics  . Smoking status: Never Smoker   . Smokeless tobacco: None  . Alcohol Use: No  . Drug Use: Yes    Special: Marijuana  . Sexual Activity: Not Asked   Other Topics Concern  . None   Social History Narrative    Hospital Course:    VICKEE MORMINO was admitted for MDD (major depressive disorder), recurrent severe, without psychosis (HCC) and crisis management.  She was treated with meds listed below.  Medical problems were identified and treated as needed.  Home medications were restarted as appropriate.  Improvement was  monitored by observation and [employer] daily report of symptom reduction.  Emotional and mental status was monitored by daily self inventory reports completed by Netta Neat and clinical staff.  Patient reported continued improvement, denied any new concerns.  Patient had been compliant on medications and denied side effects.  Support and encouragement was provided.    At time of discharge, patient rated both depression and anxiety levels to be manageable and minimal.  Patient encouraged to attend groups to help with recognizing triggers of emotional crises and de-stabilizations.  Patient encouraged to attend group to help identify the positive things in life that would help in dealing with feelings of loss, depression and unhealthy or abusive tendencies.         Skyelynn N Cecilio was evaluated by the treatment team for stability and plans for continued recovery upon discharge.  She was offered further treatment options upon discharge including Residential, Intensive Outpatient and Outpatient treatment.  She will follow up with agencies listed below for medication management and counseling.  Encouraged patient to maintain satisfactory support network and home environment.  Advised to adhere to medication compliance and outpatient treatment follow up.  Prescriptions provided.       Katilyn [employer] motivation was an integral factor for scheduling further treatment.  Employment, transportation, bed availability, health status, family support, and any pending legal issues were also considered during her hospital stay.  Upon completion of this admission the patient was both mentally and medically  stable for discharge denying suicidal/homicidal ideation, auditory/visual/tactile hallucinations, delusional thoughts and paranoia.       Physical Findings: AIMS: Facial and Oral Movements Muscles of Facial Expression: None, normal Lips and Perioral Area: None, normal Jaw: None,  normal Tongue: None, normal,Extremity Movements Upper (arms, wrists, hands, fingers): None, normal Lower (legs, knees, ankles, toes): None, normal, Trunk Movements Neck, shoulders, hips: None, normal, Overall Severity Severity of abnormal movements (highest score from questions above): None, normal Incapacitation due to abnormal movements: None, normal Patient's awareness of abnormal movements (rate only patient's report): No Awareness, Dental Status Current problems with teeth and/or dentures?: No Does patient usually wear dentures?: No  CIWA:  CIWA-Ar Total: 1 COWS:  COWS Total Score: 1  Musculoskeletal: Strength & Muscle Tone: within normal limits Gait & Station: normal Patient leans: N/A  Psychiatric Specialty Exam:  SEE MD SRA Physical Exam  Nursing note and vitals reviewed. Psychiatric: She has a normal mood and affect. Her speech is normal and behavior is normal. Judgment and thought content normal. Cognition and memory are normal.    Review of Systems  All other systems reviewed and are negative.   Blood pressure 111/61, pulse 72, temperature 98.4 F (36.9 C), temperature source Oral, resp. rate 16, height 5' 1.5" (1.562 m), weight 70.761 kg (156 lb), last menstrual period 03/19/2016, SpO2 100 %.Body mass index is 29 kg/(m^2).    Have you used any form of tobacco in the last 30 days? (Cigarettes, Smokeless Tobacco, Cigars, and/or Pipes): Yes  Has this patient used any form of tobacco in the last 30 days? (Cigarettes, Smokeless Tobacco, Cigars, and/or Pipes) Yes, NA  Blood Alcohol level:  Lab Results  Component Value Date   ETH <5 04/18/2016    Metabolic Disorder Labs:  No results found for: HGBA1C, MPG No results found for: PROLACTIN No results found for: CHOL, TRIG, HDL, CHOLHDL, VLDL, LDLCALC  See Psychiatric Specialty Exam and Suicide Risk Assessment completed by Attending Physician prior to discharge.  Discharge destination:  Other:  [employer]  Is patient on multiple antipsychotic therapies at discharge:  No   Has Patient had three or more failed trials of antipsychotic monotherapy by history:  No  Recommended Plan for Multiple Antipsychotic Therapies: NA     Medication List    STOP taking these medications        albuterol 108 (90 Base) MCG/ACT inhaler  Commonly known as:  PROVENTIL HFA;VENTOLIN HFA     NEXPLANON 68 MG Impl implant  Generic drug:  etonogestrel      TAKE these medications      Indication   citalopram 20 MG tablet  Commonly known as:  CELEXA  Take 1 tablet (20 mg total) by mouth daily.   Indication:  Depression     hydrOXYzine 25 MG tablet  Commonly known as:  ATARAX/VISTARIL  Take 1 tablet (25 mg total) by mouth every 6 (six) hours as needed for anxiety.   Indication:  Anxiety Neurosis           Follow-up Information    Follow up with [facility].   Why:  Walk-in clinic every Monday & Wednesday at 10am for assessment for therapy and medication management services. Bring ID, social security card, and any proof of income or a letter from shelter stating that you are residing there.   Contact information:   [address] [facility]. (located on the [address] bus route) Phone: [phone] Fax: [phone]  Follow-up recommendations:  Activity:  as tol Diet:  as tol  Comments:  1.  Take all your medications as prescribed.   2.  Report any adverse side effects to outpatient provider. 3.  Patient instructed to not use alcohol or illegal drugs while on prescription medicines. 4.  In the event of worsening symptoms, instructed patient to call 911, the crisis hotline or go to nearest emergency room for evaluation of symptoms.  Signed: Lindwood QuaSheila May Agustin, NP [facility] 04/28/2016, 10:53 AM  Patient seen, Suicide Assessment Completed.  Disposition Plan Reviewed

## 2016-04-28 NOTE — Progress Notes (Signed)
Discharge note: pt received both written and verbal discharge instructions. Pt verbalized understanding of discharge instructions. Pt agreed to f/u appt and med regimen. Pt received sample meds and prescriptions. Pt received SRA, AVS and transition record. Pt received belongings from room and locker. Pt safely discharged to lobby.

## 2016-04-28 NOTE — Progress Notes (Signed)
Adult Psychoeducational Group Note  Date:  04/28/2016 Time: 0845 Group Topic/Focus:  Orientation:   The focus of this group is to educate the patient on the purpose and policies of crisis stabilization and provide a format to answer questions about their admission.  The group details unit policies and expectations of patients while admitted.  Participation Level:  Did Not Attend  Participation Quality:    Affect:    Cognitive:    Insight:   Engagement in Group:    Modes of Intervention:    Additional Comments:  Excell Neyland L 04/28/2016, 2:25 PM

## 2016-04-28 NOTE — Progress Notes (Signed)
  [facility] Adult [facility] Management Discharge Plan :  Will you be returning to the same living situation after discharge:  No. [facility] At discharge, do you have transportation home?: Yes,  family will transport Do you have the ability to pay for your medications: Yes,  patient will be provided with prescriptions at discharge  Release of information consent forms completed and in the chart;  Patient's signature needed at discharge.  Patient to Follow up at: Follow-up Information    [facility]   Why:  Walk-in clinic every Monday & Wednesday at 10am for assessment for therapy and medication management services. Bring ID, social security card, and any proof of income or a letter from shelter stating that you are residing there.   Contact information:   [address] (located on the [address] bus route) Phone: [phone] Fax: [phone]      Next level of care provider has access to [facility]: No  Safety Planning and Suicide Prevention discussed: Yes,  with patient and mother  Have you used any form of tobacco in the last 30 days? (Cigarettes, Smokeless Tobacco, Cigars, and/or Pipes): Yes  Has patient been referred to the Quitline?: Patient refused referral  Patient has been referred for addiction treatment: Yes  Assia Meanor, [place] CarboKristin L 04/28/2016, 9:14 AM

## 2016-04-28 NOTE — Tx Team (Signed)
Interdisciplinary Treatment Plan Update (Adult) Date: 04/28/2016    Time Reviewed: 9:30 AM  Progress in Treatment: Attending groups: Yes Participating in groups: Yes Taking medication as prescribed: Yes Tolerating medication: Yes Family/Significant other contact made: Yes, CSW spoke with mother and godmother Patient understands diagnosis: Yes Discussing patient identified problems/goals with staff: Yes Medical problems stabilized or resolved: Yes Denies suicidal/homicidal ideation: Yes Issues/concerns per patient self-inventory: Yes Other:  New problem(s) identified: N/A  Discharge Plan or Barriers: Patient plans to discharge to [facility] to follow up with outpatient services.   Reason for Continuation of Hospitalization:  Depression Anxiety Medication Stabilization   Comments: N/A  Estimated length of stay: Discharge anticipated for today 04/28/16    Patient is a 20 year old female who presented to the hospital with SI following an argument with her boyfriend. Patient will benefit from crisis stabilization, medication evaluation, group therapy and psycho education in addition to case management for discharge planning. At discharge, it is recommended that Pt remain compliant with established discharge plan and continued treatment.   Review of initial/current patient goals per problem list:  1. Goal(s): Patient will participate in aftercare plan   Met: Yes   Target date: 3-5 days post admission date   As evidenced by: Patient will participate within aftercare plan AEB aftercare provider and housing plan at discharge being identified.  7/5: Goal met. Patient plans to return home to follow up with outpatient services.   7/13: Goal met. Patient plans to discharge to [facility] to follow up with outpatient services.    2. Goal (s): Patient will exhibit decreased depressive symptoms and suicidal ideations.   Met: Yes   Target date: 3-5 days  post admission date   As evidenced by: Patient will utilize self rating of depression at 3 or below and demonstrate decreased signs of depression or be deemed stable for discharge by MD.  7/5: Goal met. Patient rates depression at 3 denies SI.     Patient:    Family:    Physician: Dr. Parke Poisson 04/28/2016 9:30 AM  Nursing: Darrol Angel, Mayra Neer, RN 04/28/2016 9:30 AM  Clinical Social Worker: Tilden Fossa, LCSW 04/28/2016 9:30 AM  Other: Nira Conn Smart, LCSW ; Peri Maris LCSWA 04/28/2016 9:30 AM  Other:    Other:    Other: Agustina Caroli, Samuel Jester, NP 04/28/2016 9:30 AM  Other:     Scribe for Treatment Team:  Tilden Fossa, [place]

## 2016-04-28 NOTE — [facility] Suicide Risk Assessment (Addendum)
[facility] Discharge Suicide Risk Assessment   Principal Problem: MDD (major depressive disorder), recurrent severe, without psychosis (HCC) Discharge Diagnoses:  Patient Active Problem List   Diagnosis Date Noted  . Severe major depression, single episode, without psychotic features (HCC) [F32.2] 04/19/2016  . MDD (major depressive disorder), recurrent severe, without psychosis (HCC) [F33.2] 04/19/2016    Total Time spent with patient: 30 minutes  Musculoskeletal: Strength & Muscle Tone: within normal limits Gait & Station: normal Patient leans: N/A  Psychiatric Specialty Exam: ROS no headache, no chest pain, no shortness of breath, no vomiting , no rash  Blood pressure 111/61, pulse 72, temperature 98.4 F (36.9 C), temperature source Oral, resp. rate 16, height 5' 1.5" (1.562 m), weight 156 lb (70.761 kg), last menstrual period 03/19/2016, SpO2 100 %.Body mass index is 29 kg/(m^2).  General Appearance: Well Groomed  Eye Contact::  Good  Speech:  Normal Rate409  Volume:  Normal  Mood:  improved, less depressed, less severely anxious   Affect:  more reactive, less anxious   Thought Process:  Linear  Orientation:  Full (Time, Place, and Person)  Thought Content:  denies hallucinations, no delusions , not internally preoccupied   Suicidal Thoughts:  No- at this time patient denies suicidal or self injurious ideations   Homicidal Thoughts:  No- patient denies any homicidal or violent ideations   Memory:  recent and remote grossly intact   Judgement:  Improved   Insight:  Improved   Psychomotor Activity:  Normal  Concentration:  Good  Recall:  Good  Fund of Knowledge:Good  Language: Good  Akathisia:  Negative  Handed:  Right  AIMS (if indicated):     Assets:  Communication Skills Desire for Improvement Resilience  Sleep:  Number of Hours: 5.75  Cognition: WNL  ADL's:  Intact   Mental Status Per Nursing Assessment::   On Admission:  NA  Demographic Factors:  20 year old  single female   Loss Factors: Recent break up with SO, poor relationship with grandmother, resulting in housing issues , as she states returning to live with grandmother , mother not a healthy environment for her    Risk Reduction Factors:   Sense of responsibility to family, Positive social support and Positive coping skills or problem solving skills  Continued Clinical Symptoms:  At this time patient is alert, attentive, improved compared to admission, presenting with improved mood and improved range of affect, no thought disorder, no SI or HI, no psychotic symptoms, future oriented at this time, looking forward to going [facility]. Denies medication side effects at the present time, side effects have been reviewed , including potential risk for antidepressants to be associated with increased suicidal ideations early in treatment in young adults . At patient's request we had a family meeting with mother , patient, CSW, and Clinical research associate, prior to D/C. Meeting went well, mother corroborated that patient is improved and is in agreement with discharge and discharge plan- will transport [facility] to [facility].  Cognitive Features That Contribute To Risk:  No gross cognitive deficits noted upon discharge. Is alert , attentive, and oriented x 3   Suicide Risk:  Mild:  Suicidal ideation of limited frequency, intensity, duration, and specificity.  There are no identifiable plans, no associated intent, mild dysphoria and related symptoms, good self-control (both objective and subjective assessment), few other risk factors, and identifiable protective factors, including available and accessible social support.  Follow-up Information    Follow up with [facility].   Why:  Walk-in clinic every Monday & Wednesday at 10am for assessment for therapy and medication management services. Bring ID, social security card, and any proof of income or a letter from shelter stating that you are residing there.    Contact information:   [address] (located on the [address] bus route) Phone: [phone] Fax: [phone]      Plan Of Care/Follow-up recommendations:  Activity:  as tolerated  Diet:  Regular Tests:  NA Other:  See below   Patient is leaving unit in good spirits  [facility]  She plans to follow up as above   Nehemiah MassedOBOS, FERNANDO, MD 04/28/2016, 1:44 PM

## 2016-08-18 ENCOUNTER — Emergency Department (HOSPITAL_COMMUNITY): Payer: Self-pay

## 2016-08-18 ENCOUNTER — Encounter (HOSPITAL_COMMUNITY): Payer: Self-pay | Admitting: Emergency Medicine

## 2016-08-18 ENCOUNTER — Emergency Department (HOSPITAL_COMMUNITY)
Admission: EM | Admit: 2016-08-18 | Discharge: 2016-08-18 | Disposition: A | Discharge status: Home / Self Care | Payer: Self-pay | Attending: Emergency Medicine | Admitting: Emergency Medicine

## 2016-08-18 DIAGNOSIS — M779 Enthesopathy, unspecified: Secondary | ICD-10-CM | POA: Insufficient documentation

## 2016-08-18 DIAGNOSIS — Y999 Unspecified external cause status: Secondary | ICD-10-CM | POA: Insufficient documentation

## 2016-08-18 DIAGNOSIS — Z23 Encounter for immunization: Secondary | ICD-10-CM | POA: Insufficient documentation

## 2016-08-18 DIAGNOSIS — Y9389 Activity, other specified: Secondary | ICD-10-CM | POA: Insufficient documentation

## 2016-08-18 DIAGNOSIS — Y929 Unspecified place or not applicable: Secondary | ICD-10-CM | POA: Insufficient documentation

## 2016-08-18 DIAGNOSIS — F17210 Nicotine dependence, cigarettes, uncomplicated: Secondary | ICD-10-CM | POA: Insufficient documentation

## 2016-08-18 DIAGNOSIS — X58XXXA Exposure to other specified factors, initial encounter: Secondary | ICD-10-CM | POA: Insufficient documentation

## 2016-08-18 DIAGNOSIS — M778 Other enthesopathies, not elsewhere classified: Secondary | ICD-10-CM

## 2016-08-18 DIAGNOSIS — S60411A Abrasion of left index finger, initial encounter: Secondary | ICD-10-CM | POA: Insufficient documentation

## 2016-08-18 DIAGNOSIS — Z79899 Other long term (current) drug therapy: Secondary | ICD-10-CM | POA: Insufficient documentation

## 2016-08-18 DIAGNOSIS — J45909 Unspecified asthma, uncomplicated: Secondary | ICD-10-CM | POA: Insufficient documentation

## 2016-08-18 MED ORDER — IBUPROFEN 600 MG PO TABS: 600.0000 mg | ORAL_TABLET | Freq: Four times a day (QID) | ORAL | 0 refills | Status: DC | PRN

## 2016-08-18 MED ORDER — TETANUS-DIPHTH-ACELL PERTUSSIS 5-2.5-18.5 LF-MCG/0.5 IM SUSP
0.5000 mL | Freq: Once | INTRAMUSCULAR | Status: AC
  Administered 2016-08-18: 0.5 mL via INTRAMUSCULAR
  Filled 2016-08-18: qty 0.5

## 2016-08-18 NOTE — Discharge Instructions (Signed)
Elevate and apply ice packs on/off to your finger.  Wear the splint for few days.  Call the orthopedic doctor listed for follow-up appt if needed.  Return here for any increasing, pain, increased swelling or redness.

## 2016-08-18 NOTE — ED Triage Notes (Signed)
Pt reports she scratched her finger yesterday, started swelling and became difficult to move per pt.

## 2016-08-21 NOTE — ED Provider Notes (Signed)
[facility]PT Provider Note   CSN: [id number] Arrival date & time: 08/18/16  1321     History   Chief Complaint Chief Complaint  Patient presents with  . Finger Injury    HPI Lauren Reilly is a 20 y.o. female.  HPI  Lauren Reilly is a 20 y.o. female who presents to the Emergency Department complaining of pain and swelling of her left index finger.  She describes feeling  "soreness" in her finger with movement.  Symptoms began one day prior to arrival.  She also notes having a scratch to her finger and she is unsure if its related to the pain.  She also admits to repetitive movements of her hands in her job. She denies redness, numbness of the finger, wrist pain or fever. She has not tried any therapies prior to arrival.  Past Medical History:  Diagnosis Date  . Asthma     Patient Active Problem List   Diagnosis Date Noted  . Severe major depression, single episode, without psychotic features (HCC) 04/19/2016  . 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 KNEE ARTHROSCOPY    . TONSILLECTOMY      OB History    No data available       Home Medications    Prior to Admission medications   Medication Sig Start Date End Date Taking? Authorizing Provider  citalopram (CELEXA) 20 MG tablet Take 1 tablet (20 mg total) by mouth daily. 04/28/16  Yes Adonis BrookSheila Agustin, NP  hydrOXYzine (ATARAX/VISTARIL) 25 MG tablet Take 1 tablet (25 mg total) by mouth every 6 (six) hours as needed for anxiety. 04/28/16  Yes Adonis BrookSheila Agustin, NP  Pseudoephedrine-APAP-DM (DAYQUIL MULTI-SYMPTOM PO) Take 1 tablet by mouth 2 (two) times daily.   Yes Historical Provider, MD  ibuprofen (ADVIL,MOTRIN) 600 MG tablet Take 1 tablet (600 mg total) by mouth every 6 (six) hours as needed. Take with food 08/18/16   Pauline Ausammy Fayrene Towner, PA-C    Family History History reviewed. No pertinent family  history.  Social History Social History  Substance Use Topics  . Smoking status: Current Every Day Smoker    Packs/day: 0.50    Types: Cigarettes  . Smokeless tobacco: Never Used  . Alcohol use Yes     Comment: occcas     Allergies   Pollen extract   Review of Systems Review of Systems  Constitutional: Negative for chills and fever.  Musculoskeletal: Positive for arthralgias (left index finger pain and swelling) and joint swelling.  Skin: Negative for color change and wound.  Neurological: Negative for weakness and numbness.  All other systems reviewed and are negative.    Physical Exam Updated Vital Signs BP 120/76 (BP Location: Left Arm)   Pulse 85   Temp 97.5 F (36.4 C) (Oral)   Ht 5\' 2"  (1.575 m)   Wt 77.7 kg   LMP 07/18/2016   SpO2 100%   BMI 31.33 kg/m   Physical Exam  Constitutional: She is oriented to person, place, and time. She appears well-developed and well-nourished. No distress.  HENT:  Head: Normocephalic and atraumatic.  Cardiovascular: Normal rate and regular rhythm.   Pulmonary/Chest: Effort normal and breath sounds normal.  Musculoskeletal: Normal range of motion. She exhibits tenderness. She exhibits no edema.  Tenderness of the PIP joint of the left index finger.  No significant edema, no erythema.  Pt has FROM of the finger. 1 cm superficial  scratch of the proximal finger without surrounding erythema or excessive warmth.   Radial pulse is brisk, distal sensation intact.  CR< 2 sec.  No bony deformity. No tenderness proximally.    Neurological: She is alert and oriented to person, place, and time. She exhibits normal muscle tone. Coordination normal.  Skin: Skin is warm and dry.  Nursing note and vitals reviewed.    ED Treatments / Results  Labs (all labs ordered are listed, but only abnormal results are displayed) Labs Reviewed - No data to display  EKG  EKG Interpretation None       Radiology Dg Finger Index Left  Result  Date: 08/18/2016 CLINICAL DATA:  Initial evaluation for acute swelling, pain. No injury. EXAM: LEFT INDEX FINGER 2+V COMPARISON:  Prior radiograph from 03/27/2010. FINDINGS: There is no evidence of fracture or dislocation. There is no evidence of arthropathy or other focal bone abnormality. Soft tissues are unremarkable. IMPRESSION: Negative. Electronically Signed   By: Rise MuBenjamin  McClintock M.D.   On: 08/18/2016 14:59     Procedures Procedures (including critical care time)  Medications Ordered in ED Medications  Tdap (BOOSTRIX) injection 0.5 mL (0.5 mLs Intramuscular Given 08/18/16 1501)     Initial Impression / Assessment and Plan / ED Course  I have reviewed the triage vital signs and the nursing notes.  Pertinent labs & imaging results that were available during my care of the patient were reviewed by me and considered in my medical decision making (see chart for details).  Clinical Course     Tenderness of the PIP joint of the left index finger, but continues to have FROM.  No concerning sx's for infectious tenosynovitis.  NV intact.  Likely inflammatory.    Finger splinted.  Pt agrees to ice, splinted for comfort.  Referral to orthopedics, NSAID therapy.  Final Clinical Impressions(s) / ED Diagnoses   Final diagnoses:  Tendonitis of finger    New Prescriptions Discharge Medication List as of 08/18/2016  3:14 PM    START taking these medications   Details  ibuprofen (ADVIL,MOTRIN) 600 MG tablet Take 1 tablet (600 mg total) by mouth every 6 (six) hours as needed. Take with food, Starting Thu 08/18/2016, Print         Finnis Colee [place], PA-C 08/21/16 1254    Maia PlanJoshua G Long, MD 08/22/16 1051

## 2016-11-24 ENCOUNTER — Emergency Department (HOSPITAL_COMMUNITY)
Admission: EM | Admit: 2016-11-24 | Discharge: 2016-11-24 | Disposition: A | Discharge status: Home / Self Care | Payer: Self-pay | Attending: Emergency Medicine | Admitting: Emergency Medicine

## 2016-11-24 ENCOUNTER — Encounter (HOSPITAL_COMMUNITY): Payer: Self-pay | Admitting: *Deleted

## 2016-11-24 DIAGNOSIS — Z79899 Other long term (current) drug therapy: Secondary | ICD-10-CM | POA: Insufficient documentation

## 2016-11-24 DIAGNOSIS — F17210 Nicotine dependence, cigarettes, uncomplicated: Secondary | ICD-10-CM | POA: Insufficient documentation

## 2016-11-24 DIAGNOSIS — Z791 Long term (current) use of non-steroidal anti-inflammatories (NSAID): Secondary | ICD-10-CM | POA: Insufficient documentation

## 2016-11-24 DIAGNOSIS — J45909 Unspecified asthma, uncomplicated: Secondary | ICD-10-CM | POA: Insufficient documentation

## 2016-11-24 DIAGNOSIS — N939 Abnormal uterine and vaginal bleeding, unspecified: Secondary | ICD-10-CM

## 2016-11-24 LAB — BASIC METABOLIC PANEL
Anion gap: 10 (ref 5–15)
BUN: 14 mg/dL (ref 6–20)
CALCIUM: 8.8 mg/dL — AB (ref 8.9–10.3)
CO2: 25 mmol/L (ref 22–32)
CREATININE: 0.68 mg/dL (ref 0.44–1.00)
Chloride: 104 mmol/L (ref 101–111)
GFR calc Af Amer: >60 mL/min (ref >60)
GFR calc non Af Amer: >60 mL/min (ref >60)
GLUCOSE: 81 mg/dL (ref 65–99)
Potassium: 3.3 mmol/L — ABNORMAL LOW (ref 3.5–5.1)
Sodium: 139 mmol/L (ref 135–145)

## 2016-11-24 LAB — CBC WITH DIFFERENTIAL/PLATELET
BASOS PCT: 1 %
Basophils Absolute: 0.1 10*3/uL (ref 0.0–0.1)
EOS ABS: 0 10*3/uL (ref 0.0–0.7)
Eosinophils Relative: 1 %
HCT: 33.2 % — ABNORMAL LOW (ref 36.0–46.0)
HEMOGLOBIN: 10.9 g/dL — AB (ref 12.0–15.0)
Lymphocytes Relative: 50 %
Lymphs Abs: 2.8 10*3/uL (ref 0.7–4.0)
MCH: 28.3 pg (ref 26.0–34.0)
MCHC: 32.8 g/dL (ref 30.0–36.0)
MCV: 86.2 fL (ref 78.0–100.0)
MONOS PCT: 6 %
Monocytes Absolute: 0.4 10*3/uL (ref 0.1–1.0)
NEUTROS ABS: 2.3 10*3/uL (ref 1.7–7.7)
Neutrophils Relative %: 42 %
Platelets: 210 10*3/uL (ref 150–400)
RBC: 3.85 MIL/uL — ABNORMAL LOW (ref 3.87–5.11)
RDW: 12.5 % (ref 11.5–15.5)
WBC: 5.6 10*3/uL (ref 4.0–10.5)

## 2016-11-24 LAB — URINALYSIS, ROUTINE W REFLEX MICROSCOPIC
BILIRUBIN URINE: NEGATIVE
GLUCOSE, UA: NEGATIVE mg/dL
KETONES UR: 40 mg/dL — AB
Nitrite: NEGATIVE
PH: 6.5 (ref 5.0–8.0)
PROTEIN: 100 mg/dL — AB
Specific Gravity, Urine: >1.03 — ABNORMAL HIGH (ref 1.005–1.030)

## 2016-11-24 LAB — WET PREP, GENITAL
Clue Cells Wet Prep HPF POC: NONE SEEN
SPERM: NONE SEEN
YEAST WET PREP: NONE SEEN

## 2016-11-24 LAB — PREGNANCY, URINE: PREG TEST UR: NEGATIVE

## 2016-11-24 LAB — URINALYSIS, MICROSCOPIC (REFLEX)

## 2016-11-24 MED ORDER — MEGESTROL ACETATE 40 MG PO TABS: ORAL_TABLET | ORAL | 0 refills | Status: DC

## 2016-11-24 MED ORDER — IBUPROFEN 800 MG PO TABS
800.0000 mg | ORAL_TABLET | Freq: Once | ORAL | Status: AC
  Administered 2016-11-24: 800 mg via ORAL
  Filled 2016-11-24: qty 1

## 2016-11-24 NOTE — ED Triage Notes (Addendum)
Pt reports having vaginal bleeding x 1 month. Pt states she can't find anyone to take her Nexplanon out.  Pt states she has been sexually active and feels that she could be pregnant.

## 2016-11-24 NOTE — Discharge Instructions (Signed)
Call Dr. Rayna SextonFerguson's office to arrange a follow-up appt.

## 2016-11-25 ENCOUNTER — Telehealth: Payer: Self-pay | Admitting: Obstetrics and Gynecology

## 2016-11-25 NOTE — ED Provider Notes (Signed)
[facility] Provider Note   CSN: [id number] Arrival date & time: 11/24/16  1907     History   Chief Complaint Chief Complaint  Patient presents with  . Vaginal Bleeding    HPI Lauren Reilly is a 21 y.o. female.  HPI   Lauren Reilly is a 21 y.o. female who presents to the Emergency Department complaining of persistent vaginal bleeding.  She reports heavy vaginal bleeding with small clots for greater than 2 months.  She reports being six months overdue to have her Nexplanon implant removed.  She reports using greater than 3-4 tampons per day and bleeding through to her clothing at night.  She states that she feels fatigued, but denies dizziness or weakness.  Continues to be sexually active with single partner and concerned if she may be pregnant.  She denies abdominal pain, vomiting, dysuria, and fever.     Past Medical History:  Diagnosis Date  . Asthma     Patient Active Problem List   Diagnosis Date Noted  . Severe major depression, single episode, without psychotic features (HCC) 04/19/2016  . 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 KNEE ARTHROSCOPY    . TONSILLECTOMY      OB History    No data available       Home Medications    Prior to Admission medications   Medication Sig Start Date End Date Taking? Authorizing Provider  citalopram (CELEXA) 20 MG tablet Take 1 tablet (20 mg total) by mouth daily. 04/28/16   Adonis Brook, NP  hydrOXYzine (ATARAX/VISTARIL) 25 MG tablet Take 1 tablet (25 mg total) by mouth every 6 (six) hours as needed for anxiety. 04/28/16   Adonis Brook, NP  ibuprofen (ADVIL,MOTRIN) 600 MG tablet Take 1 tablet (600 mg total) by mouth every 6 (six) hours as needed. Take with food 08/18/16   Icess Bertoni, PA-C  megestrol (MEGACE) 40 MG tablet One tab po TID until bleeding stops, then one tab po daily until GYN follow-up  11/24/16   Ludella Pranger, PA-C  Pseudoephedrine-APAP-DM (DAYQUIL MULTI-SYMPTOM PO) Take 1 tablet by mouth 2 (two) times daily.    Historical Provider, MD    Family History No family history on file.  Social History Social History  Substance Use Topics  . Smoking status: Current Every Day Smoker    Packs/day: 0.50    Types: Cigarettes  . Smokeless tobacco: Never Used  . Alcohol use Yes     Comment: occcas     Allergies   Pollen extract   Review of Systems Review of Systems  Constitutional: Positive for fatigue. Negative for chills and fever.  HENT: Negative for sore throat and trouble swallowing.   Respiratory: Negative for cough, chest tightness, shortness of breath and wheezing.   Cardiovascular: Negative for chest pain and palpitations.  Gastrointestinal: Negative for abdominal pain, blood in stool, nausea and vomiting.  Genitourinary: Positive for menstrual problem and vaginal bleeding. Negative for dysuria, flank pain, hematuria, vaginal discharge and vaginal pain.  Musculoskeletal: Negative for arthralgias, back pain, myalgias, neck pain and neck stiffness.  Skin: Negative for rash.  Neurological: Negative for dizziness, weakness and numbness.  Hematological: Does not bruise/bleed easily.  Psychiatric/Behavioral: Negative for confusion.     Physical Exam Updated Vital Signs BP 117/64 (BP Location: Right Arm)   Pulse 69   Temp 98.3 F (36.8 C) (Oral)   Resp 15   Ht  5\' 2"  (1.575 m)   Wt 77 kg   LMP 10/17/2016   SpO2 100%   BMI 31.04 kg/m   Physical Exam  Constitutional: She is oriented to person, place, and time. She appears well-developed and well-nourished. No distress.  HENT:  Head: Atraumatic.  Mouth/Throat: Uvula is midline and oropharynx is clear and moist.  Neck: Normal range of motion. Neck supple.  Cardiovascular: Normal rate, regular rhythm and normal heart sounds.   No murmur heard. Pulmonary/Chest: Effort normal and breath sounds normal. No  respiratory distress.  Abdominal: Soft. She exhibits no distension and no mass. There is no tenderness. There is no guarding.  Genitourinary: Uterus normal. Cervix exhibits no motion tenderness. Right adnexum displays no mass and no tenderness. Left adnexum displays no mass and no tenderness. There is bleeding in the vagina.  Genitourinary Comments: Pelvic exam chaperoned by nursing staff.  Mild to moderate blood in the vaginal vault.  No CMT, no adnexal masses or tenderness.    Musculoskeletal: Normal range of motion.  Neurological: She is alert and oriented to person, place, and time.  Skin: Skin is warm. No rash noted.  Nursing note and vitals reviewed.    ED Treatments / Results  Labs (all labs ordered are listed, but only abnormal results are displayed) Labs Reviewed  WET PREP, GENITAL - Abnormal; Notable for the following:       Result Value   Trich, Wet Prep PRESENT (*)    WBC, Wet Prep HPF POC FEW (*)    All other components within normal limits  URINALYSIS, ROUTINE W REFLEX MICROSCOPIC - Abnormal; Notable for the following:    Specific Gravity, Urine >1.030 (*)    Hgb urine dipstick LARGE (*)    Ketones, ur 40 (*)    Protein, ur 100 (*)    Leukocytes, UA TRACE (*)    All other components within normal limits  BASIC METABOLIC PANEL - Abnormal; Notable for the following:    Potassium 3.3 (*)    Calcium 8.8 (*)    All other components within normal limits  CBC WITH DIFFERENTIAL/PLATELET - Abnormal; Notable for the following:    RBC 3.85 (*)    Hemoglobin 10.9 (*)    HCT 33.2 (*)    All other components within normal limits  URINALYSIS, MICROSCOPIC (REFLEX) - Abnormal; Notable for the following:    Bacteria, UA MANY (*)    Squamous Epithelial / LPF 6-30 (*)    All other components within normal limits  URINE CULTURE  PREGNANCY, URINE  GC/CHLAMYDIA PROBE AMP ([place]) NOT AT [facility]    EKG  EKG Interpretation None       Radiology No results  found.  Procedures Procedures (including critical care time)  Medications Ordered in ED Medications  ibuprofen (ADVIL,MOTRIN) tablet 800 mg (800 mg Oral Given 11/24/16 2149)     Initial Impression / Assessment and Plan / ED Course  I have reviewed the triage vital signs and the nursing notes.  Pertinent labs & imaging results that were available during my care of the patient were reviewed by me and considered in my medical decision making (see chart for details).     Labs reviewed, pt is well appearing, has drank fluids and ate crackers here.  hemodynamically stable.  U/A shows bacteria and leuks, but pt denies dysuria sx's.  Felt to be contaminant.  Urine culture is pending.    2255  Consulted Dr. Emelda FearFerguson.  Discussed pt findings.  He recommends Megace  40 mg TID until bleeding stops.  Agrees to see pt in his office for f/u.    Final Clinical Impressions(s) / ED Diagnoses   Final diagnoses:  Abnormal uterine bleeding    New Prescriptions Discharge Medication List as of 11/24/2016 10:49 PM    START taking these medications   Details  megestrol (MEGACE) 40 MG tablet One tab po TID until bleeding stops, then one tab po daily until GYN follow-up, Print         Pauline Aus, PA-C 11/25/16 0127    Marily Memos, MD 12/05/16 [phone]

## 2016-11-25 NOTE — Telephone Encounter (Signed)
Patient called stating she was seen in the ED for heavy vaginal bleeding. Her nexplanon was supposed to be removed last July and was never removed. Pt was quoted $200, is self pay and cannot pay all up front. Advised patient to go to Social Services to apply for [employer]. Patient stated she would and then get back to us to make appointment.

## 2016-11-27 LAB — URINE CULTURE

## 2016-11-28 LAB — GC/CHLAMYDIA PROBE AMP (~~LOC~~) NOT AT ARMC
CHLAMYDIA, DNA PROBE: NEGATIVE
Neisseria Gonorrhea: NEGATIVE

## 2016-12-19 IMAGING — DX DG FINGER INDEX 2+V*L*
3 series · 3 of 3 positions shown · non-contrast
Comparison: Prior radiograph from 03/27/2010.

CLINICAL DATA: Initial evaluation for acute swelling, pain. No
injury.

EXAM:
LEFT INDEX FINGER 2+V

[finger ap]
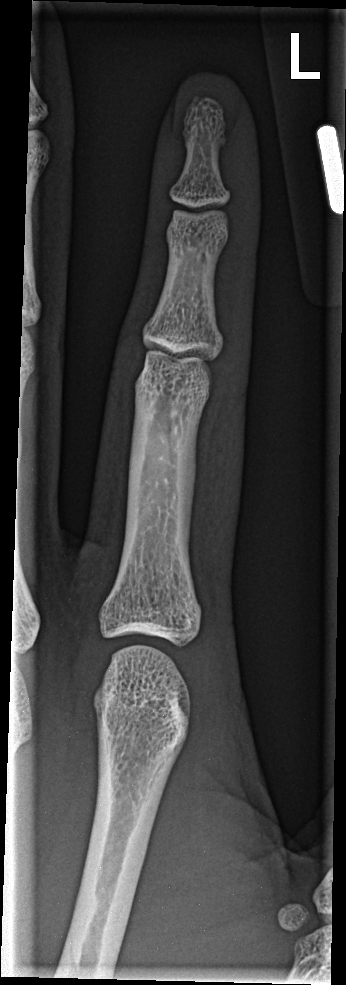

[finger obl]
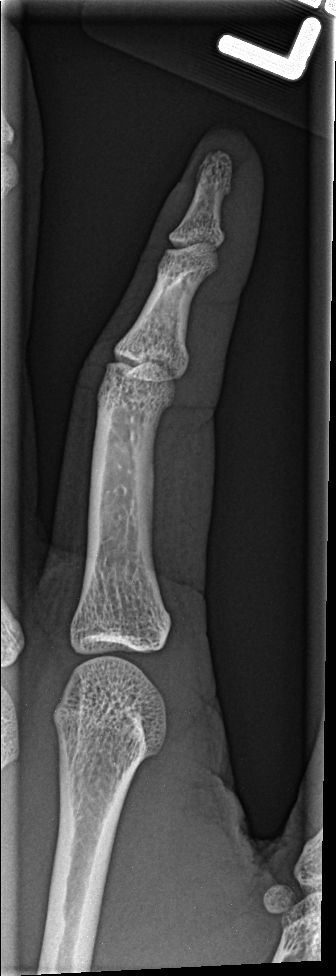

[finger lat]
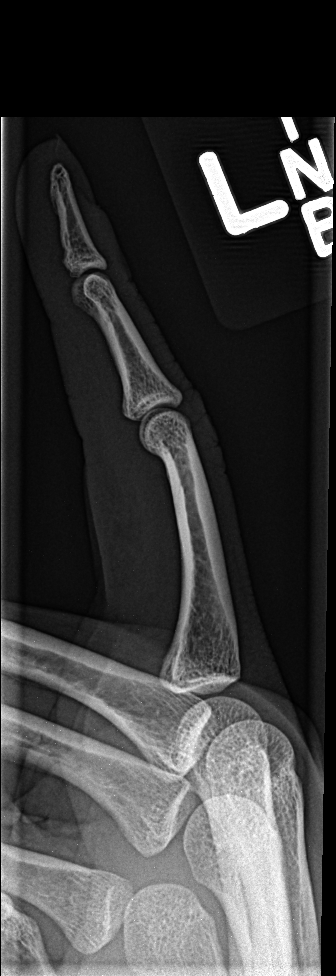

[3 of 3 positions shown; findings below may reference images not displayed]

FINDINGS: There is no evidence of fracture or dislocation. There is no
evidence of arthropathy or other focal bone abnormality. Soft
tissues are unremarkable.
IMPRESSION: Negative.

## 2017-09-19 ENCOUNTER — Encounter (HOSPITAL_COMMUNITY): Payer: Self-pay | Admitting: Nurse Practitioner

## 2017-09-19 ENCOUNTER — Emergency Department (HOSPITAL_COMMUNITY)
Admission: EM | Admit: 2017-09-19 | Discharge: 2017-09-19 | Disposition: A | Discharge status: Other Acute Inpatient Hospital | Payer: Self-pay | Attending: Emergency Medicine | Admitting: Emergency Medicine

## 2017-09-19 ENCOUNTER — Inpatient Hospital Stay (HOSPITAL_COMMUNITY)
Admission: AD | Admit: 2017-09-19 | Discharge: 2017-09-22 | DRG: 885 | Disposition: A | Discharge status: Home / Self Care | Payer: Federal, State, Local not specified - Other | Source: Intra-hospital | Attending: Psychiatry | Admitting: Psychiatry

## 2017-09-19 ENCOUNTER — Other Ambulatory Visit: Payer: Self-pay

## 2017-09-19 DIAGNOSIS — F17210 Nicotine dependence, cigarettes, uncomplicated: Secondary | ICD-10-CM | POA: Insufficient documentation

## 2017-09-19 DIAGNOSIS — Z915 Personal history of self-harm: Secondary | ICD-10-CM

## 2017-09-19 DIAGNOSIS — Z91048 Other nonmedicinal substance allergy status: Secondary | ICD-10-CM | POA: Diagnosis not present

## 2017-09-19 DIAGNOSIS — Z975 Presence of (intrauterine) contraceptive device: Secondary | ICD-10-CM | POA: Diagnosis not present

## 2017-09-19 DIAGNOSIS — S00532A Contusion of oral cavity, initial encounter: Secondary | ICD-10-CM | POA: Insufficient documentation

## 2017-09-19 DIAGNOSIS — F3162 Bipolar disorder, current episode mixed, moderate: Secondary | ICD-10-CM | POA: Diagnosis not present

## 2017-09-19 DIAGNOSIS — Z79899 Other long term (current) drug therapy: Secondary | ICD-10-CM | POA: Insufficient documentation

## 2017-09-19 DIAGNOSIS — F39 Unspecified mood [affective] disorder: Secondary | ICD-10-CM

## 2017-09-19 DIAGNOSIS — Z046 Encounter for general psychiatric examination, requested by authority: Secondary | ICD-10-CM | POA: Insufficient documentation

## 2017-09-19 DIAGNOSIS — R45851 Suicidal ideations: Secondary | ICD-10-CM | POA: Insufficient documentation

## 2017-09-19 DIAGNOSIS — F1290 Cannabis use, unspecified, uncomplicated: Secondary | ICD-10-CM | POA: Diagnosis present

## 2017-09-19 DIAGNOSIS — F319 Bipolar disorder, unspecified: Secondary | ICD-10-CM | POA: Diagnosis present

## 2017-09-19 DIAGNOSIS — Z818 Family history of other mental and behavioral disorders: Secondary | ICD-10-CM | POA: Diagnosis not present

## 2017-09-19 DIAGNOSIS — J45909 Unspecified asthma, uncomplicated: Secondary | ICD-10-CM | POA: Insufficient documentation

## 2017-09-19 DIAGNOSIS — F1210 Cannabis abuse, uncomplicated: Secondary | ICD-10-CM | POA: Diagnosis not present

## 2017-09-19 DIAGNOSIS — Y999 Unspecified external cause status: Secondary | ICD-10-CM | POA: Insufficient documentation

## 2017-09-19 DIAGNOSIS — F329 Major depressive disorder, single episode, unspecified: Secondary | ICD-10-CM | POA: Insufficient documentation

## 2017-09-19 DIAGNOSIS — W500XXA Accidental hit or strike by another person, initial encounter: Secondary | ICD-10-CM | POA: Insufficient documentation

## 2017-09-19 DIAGNOSIS — R110 Nausea: Secondary | ICD-10-CM | POA: Insufficient documentation

## 2017-09-19 DIAGNOSIS — Y929 Unspecified place or not applicable: Secondary | ICD-10-CM | POA: Insufficient documentation

## 2017-09-19 DIAGNOSIS — E876 Hypokalemia: Secondary | ICD-10-CM | POA: Diagnosis present

## 2017-09-19 DIAGNOSIS — Y9389 Activity, other specified: Secondary | ICD-10-CM | POA: Insufficient documentation

## 2017-09-19 LAB — COMPREHENSIVE METABOLIC PANEL
ALBUMIN: 4.7 g/dL (ref 3.5–5.0)
ALK PHOS: 67 U/L (ref 38–126)
ALT: 14 U/L (ref 14–54)
AST: 20 U/L (ref 15–41)
Anion gap: 11 (ref 5–15)
BUN: 15 mg/dL (ref 6–20)
CO2: 21 mmol/L — AB (ref 22–32)
Calcium: 8.9 mg/dL (ref 8.9–10.3)
Chloride: 107 mmol/L (ref 101–111)
Creatinine, Ser: 0.9 mg/dL (ref 0.44–1.00)
GFR calc Af Amer: >60 mL/min (ref >60)
GFR calc non Af Amer: >60 mL/min (ref >60)
GLUCOSE: 86 mg/dL (ref 65–99)
POTASSIUM: 3.1 mmol/L — AB (ref 3.5–5.1)
SODIUM: 139 mmol/L (ref 135–145)
Total Bilirubin: 0.4 mg/dL (ref 0.3–1.2)
Total Protein: 8.2 g/dL — ABNORMAL HIGH (ref 6.5–8.1)

## 2017-09-19 LAB — CBC WITH DIFFERENTIAL/PLATELET
BASOS PCT: 0 %
Basophils Absolute: 0 10*3/uL (ref 0.0–0.1)
EOS ABS: 0 10*3/uL (ref 0.0–0.7)
Eosinophils Relative: 0 %
HEMATOCRIT: 37.8 % (ref 36.0–46.0)
Hemoglobin: 12.1 g/dL (ref 12.0–15.0)
Lymphocytes Relative: 19 %
Lymphs Abs: 2.5 10*3/uL (ref 0.7–4.0)
MCH: 28.3 pg (ref 26.0–34.0)
MCHC: 32 g/dL (ref 30.0–36.0)
MCV: 88.5 fL (ref 78.0–100.0)
MONO ABS: 0.3 10*3/uL (ref 0.1–1.0)
MONOS PCT: 3 %
Neutro Abs: 9.9 10*3/uL — ABNORMAL HIGH (ref 1.7–7.7)
Neutrophils Relative %: 78 %
Platelets: 240 10*3/uL (ref 150–400)
RBC: 4.27 MIL/uL (ref 3.87–5.11)
RDW: 13.3 % (ref 11.5–15.5)
WBC: 12.7 10*3/uL — ABNORMAL HIGH (ref 4.0–10.5)

## 2017-09-19 LAB — RAPID URINE DRUG SCREEN, HOSP PERFORMED
AMPHETAMINES: NOT DETECTED
Barbiturates: NOT DETECTED
Benzodiazepines: NOT DETECTED
COCAINE: NOT DETECTED
OPIATES: NOT DETECTED
TETRAHYDROCANNABINOL: POSITIVE — AB

## 2017-09-19 LAB — I-STAT BETA HCG BLOOD, ED (MC, WL, AP ONLY): I-stat hCG, quantitative: <5 m[IU]/mL (ref <5)

## 2017-09-19 LAB — ETHANOL: Alcohol, Ethyl (B): 71 mg/dL — ABNORMAL HIGH (ref <10)

## 2017-09-19 MED ORDER — RISPERIDONE 1 MG PO TBDP: 2.0000 mg | ORAL_TABLET | Freq: Three times a day (TID) | ORAL | Status: DC | PRN

## 2017-09-19 MED ORDER — TRAZODONE HCL 50 MG PO TABS
50.0000 mg | ORAL_TABLET | Freq: Every evening | ORAL | Status: DC | PRN
  Administered 2017-09-19 – 2017-09-21 (×2): 50 mg via ORAL
  Filled 2017-09-19: qty 1
  Filled 2017-09-19: qty 14
  Filled 2017-09-19: qty 1
  Filled 2017-09-19: qty 14
  Filled 2017-09-19 (×5): qty 1
  Filled 2017-09-19: qty 14
  Filled 2017-09-19 (×2): qty 1

## 2017-09-19 MED ORDER — BENZOCAINE 10 % MT GEL
OROMUCOSAL | Status: DC | PRN
  Administered 2017-09-19 – 2017-09-22 (×2): via OROMUCOSAL
  Filled 2017-09-19: qty 9.4

## 2017-09-19 MED ORDER — ONDANSETRON HCL 4 MG PO TABS
4.0000 mg | ORAL_TABLET | Freq: Three times a day (TID) | ORAL | Status: DC | PRN
  Administered 2017-09-19: 4 mg via ORAL
  Filled 2017-09-19: qty 1

## 2017-09-19 MED ORDER — ARIPIPRAZOLE 10 MG PO TABS
10.0000 mg | ORAL_TABLET | Freq: Every day | ORAL | Status: DC
  Administered 2017-09-20 – 2017-09-22 (×3): 10 mg via ORAL
  Filled 2017-09-19: qty 1
  Filled 2017-09-19: qty 7
  Filled 2017-09-19 (×3): qty 1

## 2017-09-19 MED ORDER — ONDANSETRON 4 MG PO TBDP
4.0000 mg | ORAL_TABLET | Freq: Once | ORAL | Status: AC
  Administered 2017-09-19: 4 mg via ORAL
  Filled 2017-09-19: qty 1

## 2017-09-19 MED ORDER — CITALOPRAM HYDROBROMIDE 20 MG PO TABS
20.0000 mg | ORAL_TABLET | Freq: Every day | ORAL | Status: DC
  Administered 2017-09-19: 20 mg via ORAL
  Filled 2017-09-19 (×4): qty 1

## 2017-09-19 MED ORDER — ACETAMINOPHEN 325 MG PO TABS: 650.0000 mg | ORAL_TABLET | ORAL | Status: DC | PRN

## 2017-09-19 MED ORDER — ZIPRASIDONE MESYLATE 20 MG IM SOLR: 20.0000 mg | INTRAMUSCULAR | Status: DC | PRN

## 2017-09-19 MED ORDER — ARIPIPRAZOLE 5 MG PO TABS
5.0000 mg | ORAL_TABLET | Freq: Once | ORAL | Status: AC
  Administered 2017-09-19: 5 mg via ORAL
  Filled 2017-09-19: qty 1

## 2017-09-19 MED ORDER — ALUM & MAG HYDROXIDE-SIMETH 200-200-20 MG/5ML PO SUSP: 30.0000 mL | ORAL | Status: DC | PRN

## 2017-09-19 MED ORDER — MAGNESIUM HYDROXIDE 400 MG/5ML PO SUSP: 30.0000 mL | Freq: Every day | ORAL | Status: DC | PRN

## 2017-09-19 MED ORDER — HYDROXYZINE HCL 25 MG PO TABS
25.0000 mg | ORAL_TABLET | Freq: Four times a day (QID) | ORAL | Status: DC | PRN
  Administered 2017-09-19 – 2017-09-20 (×2): 25 mg via ORAL
  Filled 2017-09-19: qty 1
  Filled 2017-09-19: qty 10
  Filled 2017-09-19: qty 1

## 2017-09-19 MED ORDER — LORAZEPAM 1 MG PO TABS: 1.0000 mg | ORAL_TABLET | ORAL | Status: DC | PRN

## 2017-09-19 MED ORDER — IBUPROFEN 600 MG PO TABS
600.0000 mg | ORAL_TABLET | Freq: Four times a day (QID) | ORAL | Status: DC | PRN
  Administered 2017-09-19 – 2017-09-22 (×9): 600 mg via ORAL
  Filled 2017-09-19 (×9): qty 1
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
## 2017-09-19 NOTE — Progress Notes (Signed)
Adult Psychoeducational Group Note  Date:  09/19/2017 Time:  10:52 PM  Group Topic/Focus:  Wrap-Up Group:   The focus of this group is to help patients review their daily goal of treatment and discuss progress on daily workbooks.  Participation Level:  Active  Participation Quality:  Sharing and Supportive  Affect:  Appropriate  Cognitive:  Appropriate  Insight: Appropriate  Engagement in Group:  Engaged  Modes of Intervention:  Discussion  Additional Comments:  Client was appropriate during group. Was able to identify ways of coping with her diagnosis while also providing encouragement and support to peers.   Lyndee HensenGoins, Steffon Gladu R 09/19/2017, 10:52 PM
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
## 2017-09-19 NOTE — ED Notes (Signed)
Pt c/o of nausea  
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
## 2017-09-19 NOTE — ED Notes (Signed)
Pt says she has vomited since taking zofran, still feels nauseous.  Offered to request from MD nausea medication in form of  IM injection - pt says she does not like needles and will let me know if nausea gets worse, then she will take shot.

## 2017-09-19 NOTE — ED Notes (Signed)
[facility]. [facility]:30 [facility]. [facility].
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
## 2017-09-19 NOTE — ED Notes (Signed)
Pt mother allowed to come back to room and see pt for less than 5 minutes. Pt and mother were both given a copy of BH guidelines and visiting hours. Pt mother took pt car keys and cell phone home with her.

## 2017-09-19 NOTE — BHH Suicide Risk Assessment (Signed)
BHH INPATIENT:  Family/Significant Other Suicide Prevention Education  Suicide Prevention Education:  Patient Refusal for Family/Significant Other Suicide Prevention Education: The patient Lauren Reilly has refused to provide written consent for family/significant other to be provided Family/Significant Other Suicide Prevention Education during admission and/or prior to discharge.  Physician notified.  Ida RogueRodney B Jamarious Febo 09/19/2017, 4:31 PM
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
## 2017-09-19 NOTE — ED Notes (Signed)
TTS in progress 

## 2017-09-19 NOTE — Progress Notes (Signed)
Recreation Therapy Notes Animal-Assisted Activity (AAA) Program Checklist/Progress Notes Patient Eligibility Criteria Checklist & Daily Group note for Rec TxIntervention  Date: 12.04.2018 Time: 2:45pm Location: [address] Day[address]   AAA/T Program Assumption of Risk Form signed by Patient/ or Parent Legal Guardian Yes  Patient is free of allergies or sever asthma Yes  Patient reports no fear of animals Yes  Patient reports no history of cruelty to animals Yes  Patient understands his/her participation is voluntary Yes  Patient washes hands before animal contact Yes  Patient washes hands after animal contact Yes  Behavioral Response: Appropriate   Education:Hand Washing, Appropriate Animal Interaction   Education Outcome: Acknowledges education.   Clinical Observations/Feedback: Patient attended session and interacted appropriately with therapy dog and peers. Patient shared stories about their pets at home with group.   Jalessa Peyser L Adianna Darwin, LRT/CTRS        Dorean Daniello L 09/19/2017 2:59 PM 

## 2017-09-20 DIAGNOSIS — G4700 Insomnia, unspecified: Secondary | ICD-10-CM

## 2017-09-20 DIAGNOSIS — F17210 Nicotine dependence, cigarettes, uncomplicated: Secondary | ICD-10-CM

## 2017-09-20 DIAGNOSIS — F419 Anxiety disorder, unspecified: Secondary | ICD-10-CM

## 2017-09-20 MED ORDER — ONDANSETRON 4 MG PO TBDP: 8.0000 mg | ORAL_TABLET | Freq: Three times a day (TID) | ORAL | Status: DC | PRN

## 2017-09-20 MED ORDER — ONDANSETRON 4 MG PO TBDP
ORAL_TABLET | ORAL | Status: AC
  Administered 2017-09-20: 4 mg
  Filled 2017-09-20: qty 2
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
## 2017-09-20 NOTE — Progress Notes (Signed)
[facility] MD Progress Note  09/20/2017 1:47 PM Lauren Reilly  MRN:  [id number]   Subjective:  Patient reports that she feels a little tired today, but otherwise feels great. "That mood stabilizer is working great. I really like that Abilify." She denies any SI/HI/AVH and contracts for safety.    Objective: Patient's chart and findings reviewed and discussed with treatment team. Patient is pleasant and cooperative. She presents with very appropriate affect and mood. She is in agreement to remain to ensure medication is appropriate for her.   Principal Problem: Bipolar disorder, unspecified ([place]) Diagnosis:   Patient Active Problem List   Diagnosis Date Noted  . Bipolar disorder, unspecified ([place]) [F31.9] 09/19/2017  . Severe major depression, single episode, without psychotic features ([place]) [F32.2] 04/19/2016  . MDD (major depressive disorder), recurrent severe, without psychosis ([place]) [F33.2] 04/19/2016   Total Time spent with patient: 15 minutes  Past Psychiatric History: See H&P  Past Medical History:  Past Medical History:  Diagnosis Date  . Asthma     Past Surgical History:  Procedure Laterality Date  . KNEE ARTHROSCOPY    . TONSILLECTOMY     Family History: History reviewed. No pertinent family history. Family Psychiatric  History: See H&P Social History:  Social History   Substance and Sexual Activity  Alcohol Use Yes   Comment: occcas     Social History   Substance and Sexual Activity  Drug Use Yes  . Types: Marijuana   Comment: Pt denies    Social History   Socioeconomic History  . Marital status: Single    Spouse name: None  . Number of children: None  . Years of education: None  . Highest education level: None  Social Needs  . Financial resource strain: None  . Food insecurity - worry: None  . Food insecurity - inability: None  . Transportation needs - medical: None  . Transportation needs - non-medical: None  Occupational History  . None   Tobacco Use  . Smoking status: Current Every Day Smoker    Packs/day: 0.50    Types: Cigarettes  . Smokeless tobacco: Never Used  Substance and Sexual Activity  . Alcohol use: Yes    Comment: occcas  . Drug use: Yes    Types: Marijuana    Comment: Pt denies  . Sexual activity: Yes    Birth control/protection: Implant  Other Topics Concern  . None  Social History Narrative  . None   Additional Social History:                         Sleep: Good  Appetite:  Good  Current Medications: Current Facility-Administered Medications  Medication Dose Route Frequency Provider Last Rate Last Dose  . alum & mag hydroxide-simeth (MAALOX/MYLANTA) 200-200-20 MG/5ML suspension 30 mL  30 mL Oral Q4H PRN Patriciaann Clan E, PA-C      . ARIPiprazole (ABILIFY) tablet 10 mg  10 mg Oral Daily Izediuno, Laruth Bouchard, MD   10 mg at 09/20/17 0739  . benzocaine (ORAJEL) 10 % mucosal gel   Mouth/Throat PRN Kiley Solimine, Lowry Ram, FNP      . hydrOXYzine (ATARAX/VISTARIL) tablet 25 mg  25 mg Oral Q6H PRN Laverle Hobby, PA-C   25 mg at 09/19/17 2212  . ibuprofen (ADVIL,MOTRIN) tablet 600 mg  600 mg Oral Q6H PRN Laverle Hobby, PA-C   600 mg at 09/20/17 1347  . magnesium hydroxide (MILK OF MAGNESIA) suspension 30 mL  30 mL Oral Daily PRN Laverle Hobby, PA-C      . ondansetron (ZOFRAN-ODT) disintegrating tablet 8 mg  8 mg Oral Q8H PRN Laverle Hobby, PA-C      . traZODone (DESYREL) tablet 50 mg  50 mg Oral QHS,MR X 1 Laverle Hobby, PA-C   50 mg at 09/19/17 2212    Lab Results:  Results for orders placed or performed during the hospital encounter of 09/19/17 (from the past 48 hour(s))  Urine rapid drug screen (hosp performed)     Status: Abnormal   Collection Time: 09/19/17  3:19 AM  Result Value Ref Range   Opiates NONE DETECTED NONE DETECTED   Cocaine NONE DETECTED NONE DETECTED   Benzodiazepines NONE DETECTED NONE DETECTED   Amphetamines NONE DETECTED NONE DETECTED   Tetrahydrocannabinol  POSITIVE (A) NONE DETECTED   Barbiturates NONE DETECTED NONE DETECTED    Comment:        DRUG SCREEN FOR MEDICAL PURPOSES ONLY.  IF CONFIRMATION IS NEEDED FOR ANY PURPOSE, NOTIFY LAB WITHIN 5 DAYS.        LOWEST DETECTABLE LIMITS FOR URINE DRUG SCREEN Drug Class       Cutoff (ng/mL) Amphetamine      1000 Barbiturate      200 Benzodiazepine   409 Tricyclics       811 Opiates          300 Cocaine          300 THC              50   Comprehensive metabolic panel     Status: Abnormal   Collection Time: 09/19/17  3:40 AM  Result Value Ref Range   Sodium 139 135 - 145 mmol/L   Potassium 3.1 (L) 3.5 - 5.1 mmol/L   Chloride 107 101 - 111 mmol/L   CO2 21 (L) 22 - 32 mmol/L   Glucose, Bld 86 65 - 99 mg/dL   BUN 15 6 - 20 mg/dL   Creatinine, Ser 0.90 0.44 - 1.00 mg/dL   Calcium 8.9 8.9 - 10.3 mg/dL   Total Protein 8.2 (H) 6.5 - 8.1 g/dL   Albumin 4.7 3.5 - 5.0 g/dL   AST 20 15 - 41 U/L   ALT 14 14 - 54 U/L   Alkaline Phosphatase 67 38 - 126 U/L   Total Bilirubin 0.4 0.3 - 1.2 mg/dL   GFR calc non Af Amer >60 >60 mL/min   GFR calc Af Amer >60 >60 mL/min    Comment: (NOTE) The eGFR has been calculated using the CKD EPI equation. This calculation has not been validated in all clinical situations. eGFR's persistently <60 mL/min signify possible Chronic Kidney Disease.    Anion gap 11 5 - 15  Ethanol     Status: Abnormal   Collection Time: 09/19/17  3:40 AM  Result Value Ref Range   Alcohol, Ethyl (B) 71 (H) <10 mg/dL    Comment:        LOWEST DETECTABLE LIMIT FOR SERUM ALCOHOL IS 10 mg/dL FOR MEDICAL PURPOSES ONLY   CBC with Diff     Status: Abnormal   Collection Time: 09/19/17  3:40 AM  Result Value Ref Range   WBC 12.7 (H) 4.0 - 10.5 K/uL   RBC 4.27 3.87 - 5.11 MIL/uL   Hemoglobin 12.1 12.0 - 15.0 g/dL   HCT 37.8 36.0 - 46.0 %   MCV 88.5 78.0 - 100.0 fL   MCH 28.3 26.0 -  34.0 pg   MCHC 32.0 30.0 - 36.0 g/dL   RDW 13.3 11.5 - 15.5 %   Platelets 240 150 - 400 K/uL    Neutrophils Relative % 78 %   Neutro Abs 9.9 (H) 1.7 - 7.7 K/uL   Lymphocytes Relative 19 %   Lymphs Abs 2.5 0.7 - 4.0 K/uL   Monocytes Relative 3 %   Monocytes Absolute 0.3 0.1 - 1.0 K/uL   Eosinophils Relative 0 %   Eosinophils Absolute 0.0 0.0 - 0.7 K/uL   Basophils Relative 0 %   Basophils Absolute 0.0 0.0 - 0.1 K/uL  I-Stat beta hCG blood, ED     Status: None   Collection Time: 09/19/17  3:51 AM  Result Value Ref Range   I-stat hCG, quantitative <5.0 <5 mIU/mL   Comment 3            Comment:   GEST. AGE      CONC.  (mIU/mL)   <=1 WEEK        5 - 50     2 WEEKS       50 - 500     3 WEEKS       100 - 10,000     4 WEEKS     1,000 - 30,000        FEMALE AND NON-PREGNANT FEMALE:     LESS THAN 5 mIU/mL     Blood Alcohol level:  Lab Results  Component Value Date   ETH 71 (H) 09/19/2017   ETH <5 12/75/1700    Metabolic Disorder Labs: No results found for: HGBA1C, MPG No results found for: PROLACTIN No results found for: CHOL, TRIG, HDL, CHOLHDL, VLDL, LDLCALC  Physical Findings: AIMS: Facial and Oral Movements Muscles of Facial Expression: None, normal Lips and Perioral Area: None, normal Jaw: None, normal Tongue: None, normal,Extremity Movements Upper (arms, wrists, hands, fingers): None, normal Lower (legs, knees, ankles, toes): None, normal, Trunk Movements Neck, shoulders, hips: None, normal, Overall Severity Severity of abnormal movements (highest score from questions above): None, normal Incapacitation due to abnormal movements: None, normal Patient's awareness of abnormal movements (rate only patient's report): No Awareness, Dental Status Current problems with teeth and/or dentures?: No Does patient usually wear dentures?: No  CIWA:  CIWA-Ar Total: 0 COWS:     Musculoskeletal: Strength & Muscle Tone: within normal limits Gait & Station: normal Patient leans: N/A  Psychiatric Specialty Exam: Physical Exam  Nursing note and vitals  reviewed. Constitutional: She is oriented to person, place, and time. She appears well-developed and well-nourished.  Cardiovascular: Normal rate.  Respiratory: Effort normal.  Musculoskeletal: Normal range of motion.  Neurological: She is alert and oriented to person, place, and time.  Skin: Skin is warm.    Review of Systems  Constitutional: Negative.   HENT: Negative.   Eyes: Negative.   Respiratory: Negative.   Cardiovascular: Negative.   Gastrointestinal: Negative.   Genitourinary: Negative.   Musculoskeletal: Negative.   Skin: Negative.   Neurological: Negative.   Endo/Heme/Allergies: Negative.   Psychiatric/Behavioral: Negative.     Blood pressure 123/73, pulse 82, temperature 98.2 F (36.8 C), resp. rate 16, height 5' 0.75" (1.543 m), weight 81.6 kg (180 lb), last menstrual period 08/29/2017.Body mass index is 34.29 kg/m.  General Appearance: Casual  Eye Contact:  Good  Speech:  Clear and Coherent and Normal Rate  Volume:  Normal  Mood:  Euthymic  Affect:  Appropriate  Thought Process:  Goal Directed and Descriptions of Associations:  Intact  Orientation:  Full (Time, Place, and Person)  Thought Content:  WDL  Suicidal Thoughts:  No  Homicidal Thoughts:  No  Memory:  Immediate;   Good Recent;   Good Remote;   Good  Judgement:  Good  Insight:  Good  Psychomotor Activity:  Normal  Concentration:  Concentration: Good and Attention Span: Good  Recall:  Good  Fund of Knowledge:  Good  Language:  Good  Akathisia:  No  Handed:  Right  AIMS (if indicated):     Assets:  Communication Skills Desire for Improvement Financial Resources/Insurance Housing Physical Health Social Support Transportation  ADL's:  Intact  Cognition:  WNL  Sleep:  Number of Hours: 4.75   Problems Addressed: Bipolar Disorder   Treatment Plan Summary: Daily contact with patient to assess and evaluate symptoms and progress in treatment, Medication management and Plan is to:  -Continue  Abilify 10 mg PO Daily for mood stability -Continue Trazodone 50 mg PO QHS PRN for insomnia -Continue Vistaril 25 mg PO Q6H PRN for anxiety -Encourage group therapy participation  Lewis Shock, FNP 09/20/2017, 1:47 PM

## 2017-09-20 NOTE — Tx Team (Signed)
Interdisciplinary Treatment and Diagnostic Plan Update  09/20/2017 Time of Session: 8:48 AM  Lauren Reilly MRN: [id number]  Principal Diagnosis: Bipolar disorder, unspecified ([place])  Secondary Diagnoses: Principal Problem:   Bipolar disorder, unspecified ([place])   Current Medications:  Current Facility-Administered Medications  Medication Dose Route Frequency Provider Last Rate Last Dose  . alum & mag hydroxide-simeth (MAALOX/MYLANTA) 200-200-20 MG/5ML suspension 30 mL  30 mL Oral Q4H PRN Patriciaann Clan E, PA-C      . ARIPiprazole (ABILIFY) tablet 10 mg  10 mg Oral Daily Izediuno, Laruth Bouchard, MD   10 mg at 09/20/17 0739  . benzocaine (ORAJEL) 10 % mucosal gel   Mouth/Throat PRN Money, Lowry Ram, FNP      . hydrOXYzine (ATARAX/VISTARIL) tablet 25 mg  25 mg Oral Q6H PRN Laverle Hobby, PA-C   25 mg at 09/19/17 2212  . ibuprofen (ADVIL,MOTRIN) tablet 600 mg  600 mg Oral Q6H PRN Laverle Hobby, PA-C   600 mg at 09/20/17 0740  . magnesium hydroxide (MILK OF MAGNESIA) suspension 30 mL  30 mL Oral Daily PRN Patriciaann Clan E, PA-C      . ondansetron (ZOFRAN-ODT) disintegrating tablet 8 mg  8 mg Oral Q8H PRN Laverle Hobby, PA-C      . traZODone (DESYREL) tablet 50 mg  50 mg Oral QHS,MR X 1 Laverle Hobby, PA-C   50 mg at 09/19/17 2212    PTA Medications: Medications Prior to Admission  Medication Sig Dispense Refill Last Dose  . citalopram (CELEXA) 20 MG tablet Take 1 tablet (20 mg total) by mouth daily. (Patient not taking: Reported on 09/19/2017) 30 tablet 0 Not Taking at Unknown time  . hydrOXYzine (ATARAX/VISTARIL) 25 MG tablet Take 1 tablet (25 mg total) by mouth every 6 (six) hours as needed for anxiety. (Patient not taking: Reported on 09/19/2017) 30 tablet 0 Not Taking at Unknown time  . ibuprofen (ADVIL,MOTRIN) 600 MG tablet Take 1 tablet (600 mg total) by mouth every 6 (six) hours as needed. Take with food (Patient not taking: Reported on 09/19/2017) 30 tablet 0 Not Taking at  Unknown time  . megestrol (MEGACE) 40 MG tablet One tab po TID until bleeding stops, then one tab po daily until GYN follow-up (Patient not taking: Reported on 09/19/2017) 45 tablet 0 Not Taking at Unknown time    Patient Stressors: Financial difficulties Legal issue Marital or family conflict Medication change or noncompliance Traumatic event  Patient Strengths: Average or above average intelligence Capable of independent living General fund of knowledge Motivation for treatment/growth Supportive family/friends  Treatment Modalities: Medication Management, Group therapy, Case management,  1 to 1 session with clinician, Psychoeducation, Recreational therapy.   Physician Treatment Plan for Primary Diagnosis: Bipolar disorder, unspecified ([place]) Long Term Goal(s): Improvement in symptoms so as ready for discharge  Short Term Goals: Ability to identify changes in lifestyle to reduce recurrence of condition will improve Ability to verbalize feelings will improve Ability to disclose and discuss suicidal ideas Ability to demonstrate self-control will improve Ability to identify and develop effective coping behaviors will improve Ability to maintain clinical measurements within normal limits will improve Compliance with prescribed medications will improve Ability to identify triggers associated with substance abuse/mental health issues will improve Ability to identify changes in lifestyle to reduce recurrence of condition will improve Ability to verbalize feelings will improve Ability to disclose and discuss suicidal ideas Ability to demonstrate self-control will improve Ability to identify and develop effective coping behaviors will improve Ability  to maintain clinical measurements within normal limits will improve Compliance with prescribed medications will improve Ability to identify triggers associated with substance abuse/mental health issues will improve  Medication Management:  Evaluate patient's response, side effects, and tolerance of medication regimen.  Therapeutic Interventions: 1 to 1 sessions, Unit Group sessions and Medication administration.  Evaluation of Outcomes: Progressing  Physician Treatment Plan for Secondary Diagnosis: Principal Problem:   Bipolar disorder, unspecified ([place])   Long Term Goal(s): Improvement in symptoms so as ready for discharge  Short Term Goals: Ability to identify changes in lifestyle to reduce recurrence of condition will improve Ability to verbalize feelings will improve Ability to disclose and discuss suicidal ideas Ability to demonstrate self-control will improve Ability to identify and develop effective coping behaviors will improve Ability to maintain clinical measurements within normal limits will improve Compliance with prescribed medications will improve Ability to identify triggers associated with substance abuse/mental health issues will improve Ability to identify changes in lifestyle to reduce recurrence of condition will improve Ability to verbalize feelings will improve Ability to disclose and discuss suicidal ideas Ability to demonstrate self-control will improve Ability to identify and develop effective coping behaviors will improve Ability to maintain clinical measurements within normal limits will improve Compliance with prescribed medications will improve Ability to identify triggers associated with substance abuse/mental health issues will improve  Medication Management: Evaluate patient's response, side effects, and tolerance of medication regimen.  Therapeutic Interventions: 1 to 1 sessions, Unit Group sessions and Medication administration.  Evaluation of Outcomes: Progressing   RN Treatment Plan for Primary Diagnosis: Bipolar disorder, unspecified ([place]) Long Term Goal(s): Knowledge of disease and therapeutic regimen to maintain health will improve  Short Term Goals: Ability to identify and  develop effective coping behaviors will improve and Compliance with prescribed medications will improve  Medication Management: RN will administer medications as ordered by provider, will assess and evaluate patient's response and provide education to patient for prescribed medication. RN will report any adverse and/or side effects to prescribing provider.  Therapeutic Interventions: 1 on 1 counseling sessions, Psychoeducation, Medication administration, Evaluate responses to treatment, Monitor vital signs and CBGs as ordered, Perform/monitor CIWA, COWS, AIMS and Fall Risk screenings as ordered, Perform wound care treatments as ordered.  Evaluation of Outcomes: Progressing   LCSW Treatment Plan for Primary Diagnosis: Bipolar disorder, unspecified ([place]) Long Term Goal(s): Safe transition to appropriate next level of care at discharge, Engage patient in therapeutic group addressing interpersonal concerns.  Short Term Goals: Engage patient in aftercare planning with referrals and resources  Therapeutic Interventions: Assess for all discharge needs, 1 to 1 time with Social worker, Explore available resources and support systems, Assess for adequacy in community support network, Educate family and significant other(s) on suicide prevention, Complete Psychosocial Assessment, Interpersonal group therapy.  Evaluation of Outcomes: Met  Return home, follow up [place] in Treatment: Attending groups: Yes Participating in groups: Yes Taking medication as prescribed: Yes Toleration medication: Yes, no side effects reported at this time Family/Significant other contact made:  Patient understands diagnosis: Yes AEB Discussing patient identified problems/goals with staff: Yes Medical problems stabilized or resolved: Yes Denies suicidal/homicidal ideation: Yes Issues/concerns per patient self-inventory: None Other: N/A  New problem(s) identified: None identified at this time.    New Short Term/Long Term Goal(s): "I need to get back on my meds"  Discharge Plan or Barriers:   Reason for Continuation of Hospitalization: Mood instability Depression Anger Management Medication stabilization   Estimated Length of Stay:  3-5 days  Attendees: Patient: Lauren Reilly 09/20/2017  8:48 AM  Physician: Marchelle Folks, MD 09/20/2017  8:48 AM  Nursing: Sena Hitch, RN 09/20/2017  8:48 AM  RN Care Manager: Lars Pinks, RN 09/20/2017  8:48 AM  Social Worker: Ripley Fraise 09/20/2017  8:48 AM  Recreational Therapist: Winfield Cunas 09/20/2017  8:48 AM  Other: Norberto Sorenson 09/20/2017  8:48 AM  Other:  09/20/2017  8:48 AM    Scribe for Treatment Team:  Roque Lias LCSW 09/20/2017 8:48 AM

## 2017-09-20 NOTE — Progress Notes (Signed)
Recreation Therapy Notes  Date:  09/20/17  Time: 0930 Location: [address] Group Room  Group Topic: Stress Management  Goal Area(s) Addresses:  Patient will verbalize importance of using healthy stress management.  Patient will identify positive emotions associated with healthy stress management.   Intervention: Stress Management  Activity :  Meditation.  LRT introduced the stress management technique of meditation.  Patients were to follow along with the meditation as it guided them on letting things that are burdensome in order to progress.  Education:  Stress Management, Discharge Planning.   Education Outcome: Acknowledges edcuation/In group clarification offered/Needs additional education  Clinical Observations/Feedback: Pt did not attend group.    Giselle Brutus, LRT/CTRS         Makana Feigel A 09/20/2017 12:07 PM 

## 2017-09-20 NOTE — Tx Team (Signed)
Initial Treatment Plan 09/20/2017 2:11 AM Lauren Reilly [id number]    PATIENT STRESSORS: Financial difficulties Legal issue Marital or family conflict Medication change or noncompliance Traumatic event   PATIENT STRENGTHS: Average or above average intelligence Capable of independent living General fund of knowledge Motivation for treatment/growth Supportive family/friends   PATIENT IDENTIFIED PROBLEMS: Depression/anxiety  Risk for self harm  Court date for possession of marijuana  Non compliance of meds    "I need to get back on my meds"  "I need to learn coping skills to deal with life situations instead of lashing out in anger"         DISCHARGE CRITERIA:  Ability to meet basic life and health needs Adequate post-discharge living arrangements Improved stabilization in mood, thinking, and/or behavior Motivation to continue treatment in a less acute level of care Verbal commitment to aftercare and medication compliance  PRELIMINARY DISCHARGE PLAN: Attend aftercare/continuing care group Outpatient therapy Return to previous living arrangement  PATIENT/FAMILY INVOLVEMENT: This treatment plan has been presented to and reviewed with the patient, Lauren Reilly, and/or family member.  The patient and family have been given the opportunity to ask questions and make suggestions.  Charlott HollerSpeagle, Bessie Boyte Church, RN 09/20/2017, 2:11 AM

## 2017-09-20 NOTE — Progress Notes (Signed)
Patient verbalized that she had a "pretty good" day. First of all, the patient mentioned that her new medication seems to be working well for her. Secondly, she indicated that she feels happy all of the time and that she likes to share her positive thoughts with everyone. The patient did discuss that her mother is depressed and that she does not eat for days at a time. Despite the fact that her mother is ill, she will try to eat more regularly and understands the need to take care of her own health.

## 2017-09-20 NOTE — Progress Notes (Signed)
Data. Patient denies SI/HI/AVH. Verbally contracts for safety on the unit and to come to staff before acting of any self harm thoughts/feelings.  Patient interacting well with staff and other patients. Affect is bright and patient reports, "I an doing much better today. I really feel like  I have turned a corner." Patient also reported having, "Cramps, "this shift PRN meds taken as prescribed. On her self assessment patient reports 3/10 for depression, 0/10 for hopelessness and 5/10 for anxiety. Her goal today is, "Focus on my mental health." Action. Emotional support and encouragement offered. Education provided on medication, indications and side effect. Q 15 minute checks done for safety. Response. Safety on the unit maintained through 15 minute checks.  Medications taken as prescribed. Attended groups. Remained calm and appropriate through out shift.

## 2017-09-21 NOTE — Progress Notes (Signed)
Pt attended evening wrap up group and stated that today was a 9.5 for her due to her mother visiting. She mentioned she was able to stay awake longer today and was more social with her hall mates.

## 2017-09-21 NOTE — Progress Notes (Signed)
[facility]  09/21/2017 12:39 PM Lauren Reilly  MRN:  [id number]   Subjective:  Patient reports that she had a really good night. She has been working on her copping skills and she has been having discussions with her family. Her brothers have admitted to dealing with depression and they are proud of her for seeking help, because they haven't. They are coming to visit tonight and discuss her plan for discharge. She denies any SI/HI/AVh and contracts for safety. She is denying any depression or anxiety as well [facility].  Objective: Patient's chart and findings reviewed and discussed with treatment team. Patient presents pleasant and cooperative. She has a very appropriate affect and is talkative today. Patient will continue current medications and possibly discharge tomorrow. She has been seen in the day room interacting appropriately. She has been attending groups and participating.  Principal Problem: Bipolar disorder, unspecified (HCC) Diagnosis:   Patient Active Problem List   Diagnosis Date Noted  . Bipolar disorder, unspecified (HCC) [F31.9] 09/19/2017  . Severe major depression, single episode, without psychotic features (HCC) [F32.2] 04/19/2016  . MDD (major depressive disorder), recurrent severe, without psychosis (HCC) [F33.2] 04/19/2016   Total Time spent with patient: 15 minutes  Past Psychiatric History: See H&P  Past Medical History:  Past Medical History:  Diagnosis Date  . Asthma     Past Surgical History:  Procedure Laterality Date  . KNEE ARTHROSCOPY    . TONSILLECTOMY     Family History: History reviewed. No pertinent family history. Family Psychiatric  History: See H&P Social History:  Social History   Substance and Sexual Activity  Alcohol Use Yes   Comment: occcas     Social History   Substance and Sexual Activity  Drug Use Yes  . Types: Marijuana   Comment: Pt denies    Social History   Socioeconomic History  . Marital status:  Single    Spouse name: None  . Number of children: None  . Years of education: None  . Highest education level: None  Social Needs  . Financial resource strain: None  . Food insecurity - worry: None  . Food insecurity - inability: None  . Transportation needs - medical: None  . Transportation needs - non-medical: None  Occupational History  . None  Tobacco Use  . Smoking status: Current Every Day Smoker    Packs/day: 0.50    Types: Cigarettes  . Smokeless tobacco: Never Used  Substance and Sexual Activity  . Alcohol use: Yes    Comment: occcas  . Drug use: Yes    Types: Marijuana    Comment: Pt denies  . Sexual activity: Yes    Birth control/protection: Implant  Other Topics Concern  . None  Social History Narrative  . None   Additional Social History:                         Sleep: Good  Appetite:  Good  Current Medications: Current Facility-Administered Medications  Medication Dose Route Frequency Provider Last Rate Last Dose  . alum & mag hydroxide-simeth (MAALOX/MYLANTA) 200-200-20 MG/5ML suspension 30 mL  30 mL Oral Q4H PRN Donell SievertSimon, Spencer E, PA-C      . ARIPiprazole (ABILIFY) tablet 10 mg  10 mg Oral Daily Izediuno, Delight OvensVincent A, MD   10 mg at 09/21/17 0802  . benzocaine (ORAJEL) 10 % mucosal gel   Mouth/Throat PRN Tyjae Shvartsman, Gerlene Burdockravis B, FNP      .  hydrOXYzine (ATARAX/VISTARIL) tablet 25 mg  25 mg Oral Q6H PRN Kerry Hough, PA-C   25 mg at 09/20/17 1825  . ibuprofen (ADVIL,MOTRIN) tablet 600 mg  600 mg Oral Q6H PRN Kerry Hough, PA-C   600 mg at 09/21/17 1153  . magnesium hydroxide (MILK OF MAGNESIA) suspension 30 mL  30 mL Oral Daily PRN Donell Sievert E, PA-C      . ondansetron (ZOFRAN-ODT) disintegrating tablet 8 mg  8 mg Oral Q8H PRN Kerry Hough, PA-C      . traZODone (DESYREL) tablet 50 mg  50 mg Oral QHS,MR X 1 Kerry Hough, PA-C   50 mg at 09/19/17 2212    Lab Results: No results found for this or any previous visit (from the past 48  hour(s)).  Blood Alcohol level:  Lab Results  Component Value Date   ETH 71 (H) 09/19/2017   ETH <5 04/18/2016    Metabolic Disorder Labs: No results found for: HGBA1C, MPG No results found for: PROLACTIN No results found for: CHOL, TRIG, HDL, CHOLHDL, VLDL, LDLCALC  Physical Findings: AIMS: Facial and Oral Movements Muscles of Facial Expression: None, normal Lips and Perioral Area: None, normal Jaw: None, normal Tongue: None, normal,Extremity Movements Upper (arms, wrists, hands, fingers): None, normal Lower (legs, knees, ankles, toes): None, normal, Trunk Movements Neck, shoulders, hips: None, normal, Overall Severity Severity of abnormal movements (highest score from questions above): None, normal Incapacitation due to abnormal movements: None, normal Patient's awareness of abnormal movements (rate only patient's report): No Awareness, Dental Status Current problems with teeth and/or dentures?: No Does patient usually wear dentures?: No  CIWA:  CIWA-Ar Total: 0 COWS:     Musculoskeletal: Strength & Muscle Tone: within normal limits Gait & Station: normal Patient leans: N/A  Psychiatric Specialty Exam: Physical Exam  Nursing note and vitals reviewed. Constitutional: She is oriented to person, place, and time. She appears well-developed and well-nourished.  Cardiovascular: Normal rate.  Respiratory: Effort normal.  Musculoskeletal: Normal range of motion.  Neurological: She is alert and oriented to person, place, and time.  Skin: Skin is warm.    Review of Systems  Constitutional: Negative.   HENT: Negative.   Eyes: Negative.   Respiratory: Negative.   Cardiovascular: Negative.   Gastrointestinal: Negative.   Genitourinary: Negative.   Musculoskeletal: Negative.   Skin: Negative.   Neurological: Negative.   Endo/Heme/Allergies: Negative.   Psychiatric/Behavioral: Negative.     Blood pressure 113/81, pulse 72, temperature 98.4 F (36.9 C), temperature  source Oral, resp. rate 16, height 5' 0.75" (1.543 m), weight 81.6 kg (180 lb), last menstrual period 08/29/2017.Body mass index is 34.29 kg/m.  General Appearance: Casual  Eye Contact:  Good  Speech:  Clear and Coherent and Normal Rate  Volume:  Normal  Mood:  Euthymic  Affect:  Appropriate  Thought Process:  Goal Directed and Descriptions of Associations: Intact  Orientation:  Full (Time, Place, and Person)  Thought Content:  WDL  Suicidal Thoughts:  No  Homicidal Thoughts:  No  Memory:  Immediate;   Good Recent;   Good Remote;   Good  Judgement:  Good  Insight:  Good  Psychomotor Activity:  Normal  Concentration:  Concentration: Good and Attention Span: Good  Recall:  Good  Fund of Knowledge:  Good  Language:  Good  Akathisia:  No  Handed:  Right  AIMS (if indicated):     Assets:  Communication Skills Desire for Improvement Financial Resources/Insurance Housing Physical Health Social  Support Transportation  ADL's:  Intact  Cognition:  WNL  Sleep:  Number of Hours: 5.75   Problems Addressed: Bipolar Unspecified  Treatment Plan Summary: Daily contact with patient to assess and evaluate symptoms and progress in treatment, Medication management and Plan is to:  -Continue Abilify 10 mg PO Daily for mood stability -Continue Trazodone 50 mg PO QHS PRN for insomnia -Continue Vistaril 25 mg PO Q6H PRN for anxiety -Encourage group therapy participation  Maryfrances Bunnellravis B Ayiana Winslett, FNP 09/21/2017, 12:39 PM

## 2017-09-21 NOTE — Progress Notes (Signed)
DAR NOTE: Patient presents with calm affect and pleasant mood.  Denies suicidal thoughts, auditory and visual hallucinations.  Described energy level as high and concentration as good.  Rates depression at 2, hopelessness at 0, and anxiety at 3.  Maintained on routine safety checks.  Medications given as prescribed.  Support and encouragement offered as needed.  Attended group and participated.  States goal for today is "find a new coping skills."  Patient observed socializing with peers in the dayroom.  Motrin 600 mg given for abdominal cramps with good effect.

## 2017-09-21 NOTE — Progress Notes (Signed)
D:  Lauren Reilly has been up and visible on the unit.  She was noted in day room interacting well with peers.  She denies SI/HI or A/V hallucination.  She does complain of some anxiety but feels like things are getting better.  She has been complaining of menstrual cramps and ibuprofen has been given with good relief. A:  1:1 with RN for support and encouragement.  Medications as ordered.  Q 15 minute checks maintained for safety.  Encouraged participation in group and unit activities. R:  Lauren Reilly remains safe on the unit.  We will continue to monitor the progress towards her goals.

## 2017-09-22 MED ORDER — TRAZODONE HCL 50 MG PO TABS: 50.0000 mg | ORAL_TABLET | Freq: Every evening | ORAL | 0 refills | Status: AC | PRN

## 2017-09-22 MED ORDER — HYDROXYZINE HCL 25 MG PO TABS: 25.0000 mg | ORAL_TABLET | Freq: Four times a day (QID) | ORAL | 0 refills | Status: AC | PRN

## 2017-09-22 MED ORDER — ARIPIPRAZOLE 10 MG PO TABS
10.0000 mg | ORAL_TABLET | Freq: Every day | ORAL | 0 refills | Status: AC
Start: 2017-09-22 — End: ?

## 2017-09-22 MED ORDER — TRAZODONE HCL 50 MG PO TABS
50.0000 mg | ORAL_TABLET | Freq: Every evening | ORAL | Status: DC | PRN
  Filled 2017-09-22: qty 7
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
## 2017-09-22 NOTE — Progress Notes (Signed)
  [facility] :  Will you be returning to the same living situation after discharge:  Yes,  home At discharge, do you have transportation home?: Yes,  family Do you have the ability to pay for your medications: Yes,  mental health  Release of information consent forms completed and in the chart;  Patient's signature needed at discharge.  Patient to Follow up at: Follow-up Information    [facility] Follow up on 09/26/2017.   Why:  Tuesday at 8:30 for your hospital follow up appointment.  Bring your ID, proof if income Contact information: [address] [place] [address] [phone]           Next level of care provider [facility]:no  Safety Planning and Suicide Prevention discussed: Yes,  yes  Have you used any form of tobacco in the last 30 days? (Cigarettes, Smokeless Tobacco, Cigars, and/or Pipes): Yes  Has patient been referred to the Quitline?: Patient refused referral  Patient has been referred for addiction treatment: Pt. refused referral  Ida RogueRodney B Khalfani Weideman, LCSW 09/22/2017, 9:49 AM

## 2017-09-22 NOTE — Progress Notes (Signed)
Recreation Therapy Notes  Date: 09/22/17 Time: 0930 Location: [address] Dayroom  Group Topic: Stress Management  Goal Area(s) Addresses:  Patient will verbalize importance of using healthy stress management.  Patient will identify positive emotions associated with healthy stress management.   Intervention: Stress Management  Activity :  LRT introduced the stress management technique of meditation.  LRT read a script to allow patients to focus on being steadfast in different situations.  Education:  Stress Management, Discharge Planning.   Education Outcome: Acknowledges edcuation/In group clarification offered/Needs additional education  Clinical Observations/Feedback: Pt did not attend group.    Caroll RancherMarjette Neira Bentsen, LRT/CTRS         Caroll RancherLindsay, Ariyan Sinnett A 09/22/2017 10:39 AM

## 2017-09-22 NOTE — Progress Notes (Signed)
Patient ID: Lauren Reilly, female   DOB: 01-18-1996, 21 y.o.   MRN: [id number]  DAR Note: Pt observed in the dayroom interacting with peers. Pt at the time of assessment endorsed moderate abdominal cramping due to monthly cycle-see MAR. Pt denied any, SI, HI, anxiety, depression or AVH; "I will be going home in the morning; I can't wait to start eating good food." Support, encouragement, and safe environment provided.  15-minute safety checks continue. All Pt's questions and concerns addressed. Pt was med compliant. Pt attended wrap-up group.

## 2017-09-22 NOTE — Progress Notes (Signed)
Discharge Note:  Patient discharged home with family member.  Patient denied SI and HI.  Denied A/V hallucinations.  Denied pain.  Suicide prevention information given and discussed with patient who stated she understood and had no questions.  Patient stated she received all her belongings, clothing, toiletries, prescriptions, etc.  Patient stated she appreciated all assistance received from [facility] staff.  All required discharge information given to patient at discharge.  
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
## 2017-09-22 NOTE — Progress Notes (Signed)
D:  Patient denied SI and HI, contracts for safety.  Denied A/V hallucinations.  Denied pain. A:  Medications administered per MD orders.  Emotional support and encouragement given patient. R:  Medications administered per MD orders.  Emotional support and encouragement given patient.

## 2018-10-31 ENCOUNTER — Emergency Department (HOSPITAL_COMMUNITY)
Admission: EM | Admit: 2018-10-31 | Discharge: 2018-10-31 | Disposition: A | Discharge status: Home / Self Care | Payer: Self-pay | Attending: Emergency Medicine | Admitting: Emergency Medicine

## 2018-10-31 ENCOUNTER — Encounter (HOSPITAL_COMMUNITY): Payer: Self-pay | Admitting: Emergency Medicine

## 2018-10-31 ENCOUNTER — Other Ambulatory Visit: Payer: Self-pay

## 2018-10-31 DIAGNOSIS — R110 Nausea: Secondary | ICD-10-CM | POA: Insufficient documentation

## 2018-10-31 DIAGNOSIS — Z79899 Other long term (current) drug therapy: Secondary | ICD-10-CM | POA: Insufficient documentation

## 2018-10-31 DIAGNOSIS — R509 Fever, unspecified: Secondary | ICD-10-CM | POA: Insufficient documentation

## 2018-10-31 DIAGNOSIS — F17210 Nicotine dependence, cigarettes, uncomplicated: Secondary | ICD-10-CM | POA: Insufficient documentation

## 2018-10-31 DIAGNOSIS — J45909 Unspecified asthma, uncomplicated: Secondary | ICD-10-CM | POA: Insufficient documentation

## 2018-10-31 LAB — CBC
HCT: 36.7 % (ref 36.0–46.0)
Hemoglobin: 11.3 g/dL — ABNORMAL LOW (ref 12.0–15.0)
MCH: 27.8 pg (ref 26.0–34.0)
MCHC: 30.8 g/dL (ref 30.0–36.0)
MCV: 90.2 fL (ref 80.0–100.0)
Platelets: 257 10*3/uL (ref 150–400)
RBC: 4.07 MIL/uL (ref 3.87–5.11)
RDW: 12.7 % (ref 11.5–15.5)
WBC: 8.9 10*3/uL (ref 4.0–10.5)
nRBC: 0 % (ref 0.0–0.2)

## 2018-10-31 LAB — URINALYSIS, ROUTINE W REFLEX MICROSCOPIC
Bilirubin Urine: NEGATIVE
Glucose, UA: NEGATIVE mg/dL
Hgb urine dipstick: NEGATIVE
Ketones, ur: 80 mg/dL — AB
Leukocytes, UA: NEGATIVE
Nitrite: NEGATIVE
Protein, ur: NEGATIVE mg/dL
Specific Gravity, Urine: 1.025 (ref 1.005–1.030)
pH: 6 (ref 5.0–8.0)

## 2018-10-31 LAB — PREGNANCY, URINE: Preg Test, Ur: NEGATIVE

## 2018-10-31 LAB — COMPREHENSIVE METABOLIC PANEL
ALT: 11 U/L (ref 0–44)
AST: 16 U/L (ref 15–41)
Albumin: 4.7 g/dL (ref 3.5–5.0)
Alkaline Phosphatase: 55 U/L (ref 38–126)
Anion gap: 9 (ref 5–15)
BUN: 15 mg/dL (ref 6–20)
CO2: 23 mmol/L (ref 22–32)
Calcium: 9 mg/dL (ref 8.9–10.3)
Chloride: 106 mmol/L (ref 98–111)
Creatinine, Ser: 0.63 mg/dL (ref 0.44–1.00)
GFR calc Af Amer: >60 mL/min (ref >60)
GFR calc non Af Amer: >60 mL/min (ref >60)
Glucose, Bld: 87 mg/dL (ref 70–99)
Potassium: 3.7 mmol/L (ref 3.5–5.1)
Sodium: 138 mmol/L (ref 135–145)
Total Bilirubin: 0.5 mg/dL (ref 0.3–1.2)
Total Protein: 7.9 g/dL (ref 6.5–8.1)

## 2018-10-31 LAB — LIPASE, BLOOD: Lipase: 20 U/L (ref 11–51)

## 2018-10-31 MED ORDER — ONDANSETRON 4 MG PO TBDP
4.0000 mg | ORAL_TABLET | Freq: Once | ORAL | Status: AC
  Administered 2018-10-31: 4 mg via ORAL
  Filled 2018-10-31: qty 1

## 2018-10-31 MED ORDER — ONDANSETRON HCL 4 MG PO TABS: 4.0000 mg | ORAL_TABLET | Freq: Four times a day (QID) | ORAL | 0 refills | Status: AC

## 2018-10-31 NOTE — ED Provider Notes (Signed)
[facility] EMERGENCY DEPARTMENT Provider Note   CSN: [id number] Arrival date & time: 10/31/18  1636     History   Chief Complaint Chief Complaint  Patient presents with  . Nausea    HPI Lauren Reilly is a 23 y.o. female who presents to the ED with c/o nausea with chills and fever that started last night. Patient reports one episode of emesis earlier today. Patient reports having a slight cough that is dry and had a little congestion last week. She was around someone that was dx with influenza.   The history is provided by the patient. No language interpreter was used.  Emesis  Severity:  Mild Duration:  24 hours Timing:  Sporadic Number of daily episodes:  1 Quality:  Stomach contents Able to tolerate:  Liquids Progression:  Unchanged Chronicity:  New Recent urination:  Normal Worsened by:  Food smell Ineffective treatments:  None tried Associated symptoms: abdominal pain (cramping with period), chills, cough and fever   Associated symptoms: no diarrhea, no headaches and no myalgias   Risk factors: sick contacts     Past Medical History:  Diagnosis Date  . Asthma     Patient Active Problem List   Diagnosis Date Noted  . Bipolar disorder, unspecified (HCC) 09/19/2017  . Severe major depression, single episode, without psychotic features (HCC) 04/19/2016  . 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 KNEE ARTHROSCOPY    . TONSILLECTOMY       OB History   No obstetric history on file.      Home Medications    Prior to Admission medications   Medication Sig Start Date End Date Taking? Authorizing Provider  ARIPiprazole (ABILIFY) 10 MG tablet Take 1 tablet (10 mg total) by mouth daily. For mood control 09/22/17   Money, Feliz Beam B, FNP  hydrOXYzine (ATARAX/VISTARIL) 25 MG tablet Take 1 tablet (25 mg total) by mouth every 6 (six) hours as needed for  anxiety. 09/22/17   Money, Gerlene Burdock, FNP  ondansetron (ZOFRAN) 4 MG tablet Take 1 tablet (4 mg total) by mouth every 6 (six) hours. 10/31/18   Janne Napoleon, NP  traZODone (DESYREL) 50 MG tablet Take 1 tablet (50 mg total) by mouth at bedtime as needed for sleep. 09/22/17   Money, Gerlene Burdock, FNP    Family History Family History  Problem Relation Age of Onset  . Diabetes Other     Social History Social History   Tobacco Use  . Smoking status: Current Every Day Smoker    Packs/day: 0.50    Types: Cigarettes  . Smokeless tobacco: Never Used  Substance Use Topics  . Alcohol use: Yes    Comment: occcas  . Drug use: Yes    Types: Marijuana    Comment: Pt denies     Allergies   Pollen extract   Review of Systems Review of Systems  Constitutional: Positive for chills and fever.  HENT: Negative.   Eyes: Negative for pain, discharge and itching.  Respiratory: Positive for cough.   Cardiovascular: Negative for chest pain.  Gastrointestinal: Positive for abdominal pain (cramping with period) and vomiting. Negative for diarrhea.  Genitourinary: Positive for vaginal bleeding. Negative for decreased urine volume, dysuria, frequency and vaginal discharge.  Musculoskeletal: Negative for myalgias.  Skin: Negative for rash.  Neurological: Negative for syncope and headaches.  Hematological: Negative for adenopathy.  Psychiatric/Behavioral: Negative for confusion.  Physical Exam Updated Vital Signs BP 111/71 (BP Location: Right Arm)   Pulse 68   Temp 98.1 F (36.7 C) (Oral)   Resp 17   Ht 5' (1.524 m)   Wt 88.6 kg   LMP 10/28/2018   SpO2 98%   BMI 38.14 kg/m   Physical Exam Vitals signs and nursing note reviewed.  Constitutional:      General: She is not in acute distress.    Appearance: She is well-developed.  HENT:     Head: Normocephalic.     Right Ear: Tympanic membrane normal.     Left Ear: Tympanic membrane normal.     Nose: Nose normal.     Mouth/Throat:      Mouth: Mucous membranes are moist.     Pharynx: Oropharynx is clear.  Eyes:     Extraocular Movements: Extraocular movements intact.     Conjunctiva/sclera: Conjunctivae normal.     Pupils: Pupils are equal, round, and reactive to light.  Neck:     Musculoskeletal: Normal range of motion and neck supple.  Cardiovascular:     Rate and Rhythm: Normal rate and regular rhythm.  Pulmonary:     Effort: Pulmonary effort is normal.     Breath sounds: Normal breath sounds.  Abdominal:     General: Bowel sounds are normal.     Palpations: Abdomen is soft.     Tenderness: There is no abdominal tenderness.  Musculoskeletal: Normal range of motion.  Lymphadenopathy:     Cervical: No cervical adenopathy.  Skin:    General: Skin is warm and dry.  Neurological:     Mental Status: She is alert and oriented to person, place, and time.  Psychiatric:        Mood and Affect: Mood normal.      ED Treatments / Results  Labs (all labs ordered are listed, but only abnormal results are displayed) Labs Reviewed  CBC - Abnormal; Notable for the following components:      Result Value   Hemoglobin 11.3 (*)    All other components within normal limits  URINALYSIS, ROUTINE W REFLEX MICROSCOPIC - Abnormal; Notable for the following components:   APPearance HAZY (*)    Ketones, ur 80 (*)    All other components within normal limits  LIPASE, BLOOD  COMPREHENSIVE METABOLIC PANEL  PREGNANCY, URINE    Radiology No results found.  Procedures Procedures (including critical care time)  Medications Ordered in ED Medications  ondansetron (ZOFRAN-ODT) disintegrating tablet 4 mg (4 mg Oral Given 10/31/18 1816)   23 y.o. female here with nausea and one episode of vomiting stable for d/c and does not appear severely dehydrated and taking PO fluids in the ED and reports feeling better after Zofran. Will d/c home with Rx for Zofran and patient to f/u with PCP. Return precautions discussed.   Initial  Impression / Assessment and Plan / ED Course  I have reviewed the triage vital signs and the nursing notes.   Final Clinical Impressions(s) / ED Diagnoses   Final diagnoses:  Nausea    ED Discharge Orders         Ordered    ondansetron (ZOFRAN) 4 MG tablet  Every 6 hours     10/31/18 1933           Kerrie Buffaloeese, Katlynn Naser GiselaM, NP 10/31/18 [place]    Kohut, Stephen, MD 11/01/18 [phone]

## 2018-10-31 NOTE — ED Notes (Signed)
Pt given Sprite Zero at this time.

## 2018-10-31 NOTE — ED Triage Notes (Signed)
Patient reports nausea with sweats and chills since last night. 1 episode of emesis earlier today.

## 2018-11-06 ENCOUNTER — Emergency Department (HOSPITAL_COMMUNITY)
Admission: EM | Admit: 2018-11-06 | Discharge: 2018-11-06 | Disposition: A | Discharge status: Home / Self Care | Payer: Self-pay | Attending: Emergency Medicine | Admitting: Emergency Medicine

## 2018-11-06 ENCOUNTER — Other Ambulatory Visit: Payer: Self-pay

## 2018-11-06 ENCOUNTER — Encounter (HOSPITAL_COMMUNITY): Payer: Self-pay | Admitting: Emergency Medicine

## 2018-11-06 DIAGNOSIS — H01004 Unspecified blepharitis left upper eyelid: Secondary | ICD-10-CM | POA: Insufficient documentation

## 2018-11-06 DIAGNOSIS — J45909 Unspecified asthma, uncomplicated: Secondary | ICD-10-CM | POA: Insufficient documentation

## 2018-11-06 DIAGNOSIS — Z79899 Other long term (current) drug therapy: Secondary | ICD-10-CM | POA: Insufficient documentation

## 2018-11-06 DIAGNOSIS — F17210 Nicotine dependence, cigarettes, uncomplicated: Secondary | ICD-10-CM | POA: Insufficient documentation

## 2018-11-06 MED ORDER — ERYTHROMYCIN 5 MG/GM OP OINT
1.0000 "application " | TOPICAL_OINTMENT | Freq: Once | OPHTHALMIC | Status: AC
  Administered 2018-11-06: 1 via OPHTHALMIC
  Filled 2018-11-06: qty 3.5

## 2018-11-06 NOTE — Discharge Instructions (Signed)
Please apply the erythromycin ointment that we have given you to the eye 3 times a day for the next 7 days.  Avoid using any other topical products including the glue or fake eyelashes as this may make the situation worse.  Please see your doctor or an eye specialist preferably within 48 hours for recheck.  Emergency department for severe or worsening symptoms including pain changes in vision, discharge.

## 2018-11-06 NOTE — ED Provider Notes (Signed)
[facility] EMERGENCY DEPARTMENT Provider Note   CSN: [id number] Arrival date & time: 11/06/18  1754     History   Chief Complaint Chief Complaint  Patient presents with  . Eye Problem    HPI Netta NeatShaniece N Hew is a 23 y.o. female.  HPI  23 year old female presents with left-sided upper eyelid swelling for the last 2 days after she had applied and then removed some eyelashes with glue.  It was a new product and a new glue, she noticed a small amount of swelling which worsened today.  She denies any redness drainage discharge or changes in vision and has had no pain in the eyeball but just some swelling and discomfort of the left eyelid.  Symptoms are persistent, mild, nothing makes this better  Past Medical History:  Diagnosis Date  . Asthma     Patient Active Problem List   Diagnosis Date Noted  . Bipolar disorder, unspecified (HCC) 09/19/2017  . Severe major depression, single episode, without psychotic features (HCC) 04/19/2016  . 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 KNEE ARTHROSCOPY    . TONSILLECTOMY       OB History   No obstetric history on file.      Home Medications    Prior to Admission medications   Medication Sig Start Date End Date Taking? Authorizing Provider  ARIPiprazole (ABILIFY) 10 MG tablet Take 1 tablet (10 mg total) by mouth daily. For mood control 09/22/17   Money, Feliz Beamravis B, FNP  hydrOXYzine (ATARAX/VISTARIL) 25 MG tablet Take 1 tablet (25 mg total) by mouth every 6 (six) hours as needed for anxiety. 09/22/17   Money, Gerlene Burdockravis B, FNP  ondansetron (ZOFRAN) 4 MG tablet Take 1 tablet (4 mg total) by mouth every 6 (six) hours. 10/31/18   Janne NapoleonNeese, Hope M, NP  traZODone (DESYREL) 50 MG tablet Take 1 tablet (50 mg total) by mouth at bedtime as needed for sleep. 09/22/17   Money, Gerlene Burdockravis B, FNP    Family History Family History  Problem Relation Age  of Onset  . Diabetes Other     Social History Social History   Tobacco Use  . Smoking status: Current Every Day Smoker    Packs/day: 0.50    Types: Cigarettes  . Smokeless tobacco: Never Used  Substance Use Topics  . Alcohol use: Yes    Comment: occcas  . Drug use: Yes    Types: Marijuana    Comment: Pt denies     Allergies   Pollen extract   Review of Systems Review of Systems  Constitutional: Negative for fever.  Eyes: Negative for pain, discharge, redness, itching and visual disturbance.     Physical Exam Updated Vital Signs BP (!) 117/99 (BP Location: Right Arm)   Pulse 93   Temp 97.9 F (36.6 C) (Oral)   Resp 16   LMP 10/28/2018   SpO2 100%   Physical Exam Vitals signs and nursing note reviewed.  Constitutional:      Appearance: She is well-developed. She is not diaphoretic.  HENT:     Head: Normocephalic and atraumatic.  Eyes:     General:        Right eye: No discharge.        Left eye: No discharge.     Conjunctiva/sclera: Conjunctivae normal.     Comments: L eyelid with mild swelling - everted lids - normal, conj normal, pupils normal, no  drainage  Pulmonary:     Effort: Pulmonary effort is normal. No respiratory distress.  Skin:    General: Skin is warm and dry.     Findings: No erythema or rash.  Neurological:     Mental Status: She is alert.     Coordination: Coordination normal.      ED Treatments / Results  Labs (all labs ordered are listed, but only abnormal results are displayed) Labs Reviewed - No data to display  EKG None  Radiology No results found.  Procedures Procedures (including critical care time)  Medications Ordered in ED Medications  erythromycin ophthalmic ointment 1 application (has no administration in time range)     Initial Impression / Assessment and Plan / ED Course  I have reviewed the triage vital signs and the nursing notes.  Pertinent labs & imaging results that were available during my care  of the patient were reviewed by me and considered in my medical decision making (see chart for details).    Blepharitis Erythromycin F/u with pcp / optho  Final Clinical Impressions(s) / ED Diagnoses   Final diagnoses:  Blepharitis of left upper eyelid, unspecified type    ED Discharge Orders    None       Eber Hong, MD 11/06/18 2023

## 2018-11-06 NOTE — ED Triage Notes (Signed)
Pt c/o of left eye swelling x 2 days.  Pt states she used a different eye lash glue on Sunday.

## 2020-02-02 ENCOUNTER — Encounter (HOSPITAL_COMMUNITY): Payer: Self-pay | Admitting: Emergency Medicine

## 2020-02-02 ENCOUNTER — Emergency Department (HOSPITAL_COMMUNITY)
Admission: EM | Admit: 2020-02-02 | Discharge: 2020-02-02 | Disposition: A | Discharge status: Home / Self Care | Payer: Self-pay | Attending: Emergency Medicine | Admitting: Emergency Medicine

## 2020-02-02 ENCOUNTER — Other Ambulatory Visit: Payer: Self-pay

## 2020-02-02 DIAGNOSIS — Z202 Contact with and (suspected) exposure to infections with a predominantly sexual mode of transmission: Secondary | ICD-10-CM | POA: Insufficient documentation

## 2020-02-02 DIAGNOSIS — Z87891 Personal history of nicotine dependence: Secondary | ICD-10-CM | POA: Insufficient documentation

## 2020-02-02 DIAGNOSIS — J45909 Unspecified asthma, uncomplicated: Secondary | ICD-10-CM | POA: Insufficient documentation

## 2020-02-02 DIAGNOSIS — L739 Follicular disorder, unspecified: Secondary | ICD-10-CM | POA: Insufficient documentation

## 2020-02-02 LAB — WET PREP, GENITAL
Clue Cells Wet Prep HPF POC: NONE SEEN
Sperm: NONE SEEN
Yeast Wet Prep HPF POC: NONE SEEN

## 2020-02-02 MED ORDER — DOXYCYCLINE HYCLATE 100 MG PO TABS: 100.0000 mg | ORAL_TABLET | Freq: Two times a day (BID) | ORAL | 0 refills | Status: DC

## 2020-02-02 MED ORDER — METRONIDAZOLE 500 MG PO TABS: 500.0000 mg | ORAL_TABLET | Freq: Two times a day (BID) | ORAL | 0 refills | Status: AC

## 2020-02-02 NOTE — ED Triage Notes (Signed)
Pain in vaginal area, pt states she felt a knot on the outside of the area just noticed yesterday

## 2020-02-02 NOTE — Discharge Instructions (Addendum)
Return if any problems.

## 2020-02-02 NOTE — ED Provider Notes (Signed)
[facility] EMERGENCY DEPARTMENT Provider Note   CSN: [id number] Arrival date & time: 02/02/20  5053     History No chief complaint on file.   Lauren Reilly is a 24 y.o. female.  Patient complains of a lump in the upper vaginal area patient reports the area is painful patient has pain when she tries to sit.  Patient denies any STD risk.  She denies any vaginal discharge.  Patient denies pregnancy no urinary discomfort no fever no chills no vomiting no abdominal pain.  The history is provided by the patient. No language interpreter was used.       Past Medical History:  Diagnosis Date  . Asthma     Patient Active Problem List   Diagnosis Date Noted  . Bipolar disorder, unspecified (HCC) 09/19/2017  . Severe major depression, single episode, without psychotic features (HCC) 04/19/2016  . 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 KNEE ARTHROSCOPY    . TONSILLECTOMY       OB History   No obstetric history on file.     Family History  Problem Relation Age of Onset  . Diabetes Other     Social History   Tobacco Use  . Smoking status: Former Smoker    Packs/day: 0.50    Types: Cigarettes  . Smokeless tobacco: Never Used  Substance Use Topics  . Alcohol use: Yes    Comment: occcas  . Drug use: Yes    Types: Marijuana    Comment: Pt denies    Home Medications Prior to Admission medications   Medication Sig Start Date End Date Taking? Authorizing Provider  ARIPiprazole (ABILIFY) 10 MG tablet Take 1 tablet (10 mg total) by mouth daily. For mood control 09/22/17   Money, Gerlene Burdock, FNP  doxycycline (VIBRA-TABS) 100 MG tablet Take 1 tablet (100 mg total) by mouth 2 (two) times daily. 02/02/20   Elson Areas, PA-C  hydrOXYzine (ATARAX/VISTARIL) 25 MG tablet Take 1 tablet (25 mg total) by mouth every 6 (six) hours as needed for anxiety. 09/22/17   Money,  Gerlene Burdock, FNP  metroNIDAZOLE (FLAGYL) 500 MG tablet Take 1 tablet (500 mg total) by mouth 2 (two) times daily for 7 days. 02/02/20 02/09/20  Elson Areas, PA-C  ondansetron (ZOFRAN) 4 MG tablet Take 1 tablet (4 mg total) by mouth every 6 (six) hours. 10/31/18   Janne Napoleon, NP  traZODone (DESYREL) 50 MG tablet Take 1 tablet (50 mg total) by mouth at bedtime as needed for sleep. 09/22/17   Money, Gerlene Burdock, FNP    Allergies    Pollen extract  Review of Systems   Review of Systems  All other systems reviewed and are negative.   Physical Exam Updated Vital Signs BP 125/78 (BP Location: Left Arm)   Pulse 88   Temp 98.3 F (36.8 C) (Oral)   Ht 5\' 1"  (1.549 m)   Wt 81.6 kg   LMP 01/23/2020 (Approximate)   SpO2 100%   BMI 34.01 kg/m   Physical Exam Constitutional:      Appearance: She is well-developed.  HENT:     Head: Normocephalic and atraumatic.  Eyes:     Conjunctiva/sclera: Conjunctivae normal.     Pupils: Pupils are equal, round, and reactive to light.  Cardiovascular:     Rate and Rhythm: Normal rate.  Abdominal:     Palpations: Abdomen is soft.  Tenderness: There is no abdominal tenderness.  Genitourinary:    Vagina: Vaginal discharge present.     Comments: Vaginal discharge,  Thick white,  Adnexa no masses,  Cervix nontender 2 small pea-sized lumps upper pubic area areas appear more consistent with the folliculitis  Musculoskeletal:        General: Normal range of motion.     Cervical back: Normal range of motion and neck supple.  Skin:    General: Skin is warm.     ED Results / Procedures / Treatments   Labs (all labs ordered are listed, but only abnormal results are displayed) Labs Reviewed  WET PREP, GENITAL - Abnormal; Notable for the following components:      Result Value   Trich, Wet Prep FEW (*)    WBC, Wet Prep HPF POC MANY (*)    All other components within normal limits  GC/CHLAMYDIA PROBE AMP ([place]) NOT AT [facility]     EKG None  Radiology No results found.  Procedures Procedures (including critical care time)  Medications Ordered in ED Medications - No data to display  ED Course  I have reviewed the triage vital signs and the nursing notes.  Pertinent labs & imaging results that were available during my care of the patient were reviewed by me and considered in my medical decision making (see chart for details).    MDM Rules/Calculators/A&P                      MDM: Pt given rx for doxycycline and flagyl  Final Clinical Impression(s) / ED Diagnoses Final diagnoses:  Trichomonas contact  Folliculitis    Rx / DC Orders ED Discharge Orders         Ordered    doxycycline (VIBRA-TABS) 100 MG tablet  2 times daily     02/02/20 0810    metroNIDAZOLE (FLAGYL) 500 MG tablet  2 times daily     02/02/20 0810        An After Visit Summary was printed and given to the patient.    Fransico Meadow, PA-C 02/02/20 [id number]    Veryl Speak, MD 02/02/20 [phone]

## 2020-02-03 LAB — GC/CHLAMYDIA PROBE AMP (~~LOC~~) NOT AT ARMC
Chlamydia: POSITIVE — AB
Comment: NEGATIVE
Comment: NORMAL
Neisseria Gonorrhea: NEGATIVE

## 2020-05-09 ENCOUNTER — Emergency Department (HOSPITAL_COMMUNITY)
Admission: EM | Admit: 2020-05-09 | Discharge: 2020-05-09 | Disposition: A | Discharge status: Home / Self Care | Payer: Self-pay | Attending: Emergency Medicine | Admitting: Emergency Medicine

## 2020-05-09 ENCOUNTER — Encounter (HOSPITAL_COMMUNITY): Payer: Self-pay | Admitting: Emergency Medicine

## 2020-05-09 ENCOUNTER — Other Ambulatory Visit: Payer: Self-pay

## 2020-05-09 DIAGNOSIS — H5711 Ocular pain, right eye: Secondary | ICD-10-CM | POA: Insufficient documentation

## 2020-05-09 DIAGNOSIS — Z87891 Personal history of nicotine dependence: Secondary | ICD-10-CM | POA: Insufficient documentation

## 2020-05-09 DIAGNOSIS — J45909 Unspecified asthma, uncomplicated: Secondary | ICD-10-CM | POA: Insufficient documentation

## 2020-05-09 MED ORDER — FLUORESCEIN SODIUM 1 MG OP STRP
1.0000 | ORAL_STRIP | Freq: Once | OPHTHALMIC | Status: AC
  Administered 2020-05-09: 1 via OPHTHALMIC
  Filled 2020-05-09: qty 1

## 2020-05-09 MED ORDER — TETRACAINE HCL 0.5 % OP SOLN
2.0000 [drp] | Freq: Once | OPHTHALMIC | Status: AC
  Administered 2020-05-09: 2 [drp] via OPHTHALMIC
  Filled 2020-05-09: qty 4

## 2020-05-09 MED ORDER — POLYMYXIN B-TRIMETHOPRIM 10000-0.1 UNIT/ML-% OP SOLN
1.0000 [drp] | OPHTHALMIC | Status: DC
  Administered 2020-05-09: 1 [drp] via OPHTHALMIC
  Filled 2020-05-09 (×2): qty 10

## 2020-05-09 MED ORDER — KETOROLAC TROMETHAMINE 0.5 % OP SOLN: 1.0000 [drp] | Freq: Once | OPHTHALMIC | Status: DC

## 2020-05-09 NOTE — Discharge Instructions (Addendum)
It is unclear what is causing your eye pain at this time.  I do not see anything serious but he will need follow-up with an eye doctor to recheck things.  Call Monday morning.  Return to the ER if symptoms worsen.

## 2020-05-09 NOTE — ED Provider Notes (Signed)
[facility] EMERGENCY DEPARTMENT Provider Note   CSN: [id number] Arrival date & time: 05/09/20  2423     History Chief Complaint  Patient presents with  . Eye Pain    Lauren Reilly is a 24 y.o. female.  Patient reports that she woke up with the right eye pain.  Pain is in the upper outer portion of the eye and worsens when she looks up.  She reports that it feels like there is something in the eye.  She is unaware of any injury.  She has not had vision change.        Past Medical History:  Diagnosis Date  . Asthma     Patient Active Problem List   Diagnosis Date Noted  . Bipolar disorder, unspecified (HCC) 09/19/2017  . Severe major depression, single episode, without psychotic features (HCC) 04/19/2016  . 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 KNEE ARTHROSCOPY    . TONSILLECTOMY       OB History   No obstetric history on file.     Family History  Problem Relation Age of Onset  . Diabetes Other     Social History   Tobacco Use  . Smoking status: Former Smoker    Packs/day: 0.50    Types: Cigarettes  . Smokeless tobacco: Never Used  Vaping Use  . Vaping Use: Never used  Substance Use Topics  . Alcohol use: Yes    Comment: occcas  . Drug use: Yes    Types: Marijuana    Comment: Pt denies    Home Medications Prior to Admission medications   Medication Sig Start Date End Date Taking? Authorizing Provider  ARIPiprazole (ABILIFY) 10 MG tablet Take 1 tablet (10 mg total) by mouth daily. For mood control 09/22/17   Money, Gerlene Burdock, FNP  doxycycline (VIBRA-TABS) 100 MG tablet Take 1 tablet (100 mg total) by mouth 2 (two) times daily. 02/02/20   Elson Areas, PA-C  hydrOXYzine (ATARAX/VISTARIL) 25 MG tablet Take 1 tablet (25 mg total) by mouth every 6 (six) hours as needed for anxiety. 09/22/17   Money, Gerlene Burdock, FNP  ondansetron (ZOFRAN) 4 MG  tablet Take 1 tablet (4 mg total) by mouth every 6 (six) hours. 10/31/18   Janne Napoleon, NP  traZODone (DESYREL) 50 MG tablet Take 1 tablet (50 mg total) by mouth at bedtime as needed for sleep. 09/22/17   Money, Gerlene Burdock, FNP    Allergies    Pollen extract  Review of Systems   Review of Systems  Eyes: Positive for pain.  All other systems reviewed and are negative.   Physical Exam Updated Vital Signs BP (!) 117/86 (BP Location: Right Arm)   Pulse 98   Temp 97.9 F (36.6 C) (Oral)   Resp 18   Ht 5\' 1"  (1.549 m)   Wt 79.4 kg   SpO2 100%   BMI 33.07 kg/m   Physical Exam Vitals and nursing note reviewed.  Constitutional:      Appearance: Normal appearance.  HENT:     Head: Normocephalic and atraumatic.  Eyes:     General: Lids are normal. Lids are everted, no foreign bodies appreciated. Vision grossly intact. Gaze aligned appropriately.     Intraocular pressure: Right eye pressure is 14 mmHg.     Extraocular Movements: Extraocular movements intact.     Conjunctiva/sclera: Conjunctivae normal.     Right eye:  No exudate or hemorrhage.    Pupils: Pupils are equal, round, and reactive to light.     Right eye: No corneal abrasion or fluorescein uptake. Seidel exam negative.  Neurological:     Mental Status: She is alert.     ED Results / Procedures / Treatments   Labs (all labs ordered are listed, but only abnormal results are displayed) Labs Reviewed - No data to display  EKG None  Radiology No results found.  Procedures Procedures (including critical care time)  Medications Ordered in ED Medications  tetracaine (PONTOCAINE) 0.5 % ophthalmic solution 2 drop (has no administration in time range)  fluorescein ophthalmic strip 1 strip (has no administration in time range)  ketorolac (ACULAR) 0.5 % ophthalmic solution 1 drop (has no administration in time range)  trimethoprim-polymyxin b (POLYTRIM) ophthalmic solution 1 drop (has no administration in time range)     ED Course  I have reviewed the triage vital signs and the nursing notes.  Pertinent labs & imaging results that were available during my care of the patient were reviewed by me and considered in my medical decision making (see chart for details).    MDM Rules/Calculators/A&P                          Patient reports complete resolution of pain after tetracaine was placed in her eye.  Ocular examination is unremarkable.  No fluorescein uptake.  No evidence of abrasion or ulceration.  She does wear contacts, recommend she does not wear any contacts until symptoms resolve.  Intraocular pressure is normal, no sign of glaucoma.  Will treat empirically with antibiotics, suspect that there is some level of healing abrasion present.  Follow-up with ophthalmology Monday.  Final Clinical Impression(s) / ED Diagnoses Final diagnoses:  Eye pain, right    Rx / DC Orders ED Discharge Orders    None       Berkeley Vanaken, Canary Brim, MD 05/09/20 [phone]

## 2020-05-09 NOTE — ED Triage Notes (Signed)
Pt having pain to right eye, states it feels like something is in it.

## 2022-06-23 ENCOUNTER — Encounter (HOSPITAL_COMMUNITY): Payer: Self-pay | Admitting: Emergency Medicine

## 2022-06-23 ENCOUNTER — Emergency Department (HOSPITAL_COMMUNITY)
Admission: EM | Admit: 2022-06-23 | Discharge: 2022-06-24 | Disposition: A | Discharge status: Home / Self Care | Payer: BC Managed Care – PPO | Attending: Emergency Medicine | Admitting: Emergency Medicine

## 2022-06-23 ENCOUNTER — Other Ambulatory Visit: Payer: Self-pay

## 2022-06-23 DIAGNOSIS — L03115 Cellulitis of right lower limb: Secondary | ICD-10-CM | POA: Diagnosis present

## 2022-06-23 NOTE — ED Triage Notes (Signed)
Pt c/o insect bite to the right thigh since yesterday.

## 2022-06-24 MED ORDER — DOXYCYCLINE HYCLATE 100 MG PO TABS
100.0000 mg | ORAL_TABLET | Freq: Once | ORAL | Status: AC
  Administered 2022-06-24: 100 mg via ORAL
  Filled 2022-06-24: qty 1

## 2022-06-24 MED ORDER — DOXYCYCLINE HYCLATE 100 MG PO CAPS: 100.0000 mg | ORAL_CAPSULE | Freq: Two times a day (BID) | ORAL | 0 refills | Status: AC

## 2022-06-24 NOTE — ED Provider Notes (Signed)
[facility] EMERGENCY DEPARTMENT Provider Note   CSN: [id number] Arrival date & time: 06/23/22  2055     History  Chief Complaint  Patient presents with   Insect Bite    Lauren Reilly is a 26 y.o. female.  Patient presents to the emergency department with concerns over a bite on her right thigh.  Patient reports that symptoms began yesterday.  She says there was a tiny blister that she squeezed and now the area surrounding it is red, swollen, hard to the touch.  Area is expanding.       Home Medications Prior to Admission medications   Medication Sig Start Date End Date Taking? Authorizing Provider  doxycycline (VIBRAMYCIN) 100 MG capsule Take 1 capsule (100 mg total) by mouth 2 (two) times daily. 06/24/22  Yes Necie Wilcoxson, Canary Brim, MD  ARIPiprazole (ABILIFY) 10 MG tablet Take 1 tablet (10 mg total) by mouth daily. For mood control 09/22/17   Money, Feliz Beam B, FNP  hydrOXYzine (ATARAX/VISTARIL) 25 MG tablet Take 1 tablet (25 mg total) by mouth every 6 (six) hours as needed for anxiety. 09/22/17   Money, Gerlene Burdock, FNP  ondansetron (ZOFRAN) 4 MG tablet Take 1 tablet (4 mg total) by mouth every 6 (six) hours. 10/31/18   Janne Napoleon, NP  traZODone (DESYREL) 50 MG tablet Take 1 tablet (50 mg total) by mouth at bedtime as needed for sleep. 09/22/17   Money, Gerlene Burdock, FNP      Allergies    Pollen extract    Review of Systems   Review of Systems  Physical Exam Updated Vital Signs BP (!) 131/90 (BP Location: Right Arm)   Pulse 85   Temp 99.4 F (37.4 C) (Oral)   Resp 18   Ht 5\' 1"  (1.549 m)   Wt 105.3 kg   LMP 06/12/2022   SpO2 95%   BMI 43.87 kg/m  Physical Exam Vitals and nursing note reviewed.  Constitutional:      General: She is not in acute distress.    Appearance: She is well-developed.  HENT:     Head: Normocephalic and atraumatic.     Mouth/Throat:     Mouth: Mucous membranes are moist.  Eyes:     General: Vision grossly intact. Gaze aligned  appropriately.     Extraocular Movements: Extraocular movements intact.     Conjunctiva/sclera: Conjunctivae normal.  Cardiovascular:     Rate and Rhythm: Normal rate and regular rhythm.     Pulses: Normal pulses.     Heart sounds: Normal heart sounds, S1 normal and S2 normal. No murmur heard.    No friction rub. No gallop.  Pulmonary:     Effort: Pulmonary effort is normal. No respiratory distress.     Breath sounds: Normal breath sounds.  Abdominal:     General: Bowel sounds are normal.     Palpations: Abdomen is soft.     Tenderness: There is no abdominal tenderness. There is no guarding or rebound.     Hernia: No hernia is present.  Musculoskeletal:        General: No swelling.     Cervical back: Full passive range of motion without pain, normal range of motion and neck supple. No spinous process tenderness or muscular tenderness. Normal range of motion.     Right lower leg: No edema.     Left lower leg: No edema.  Skin:    General: Skin is warm and dry.     Capillary Refill: Capillary  refill takes less than 2 seconds.     Findings: No ecchymosis, erythema or wound.     Comments: 4 cm circular area of erythema, small scab in the center.  Slight induration at center but no fluctuance.  Neurological:     General: No focal deficit present.     Mental Status: She is alert and oriented to person, place, and time.     GCS: GCS eye subscore is 4. GCS verbal subscore is 5. GCS motor subscore is 6.     Cranial Nerves: Cranial nerves 2-12 are intact.     Sensory: Sensation is intact.     Motor: Motor function is intact.     Coordination: Coordination is intact.  Psychiatric:        Attention and Perception: Attention normal.        Mood and Affect: Mood normal.        Speech: Speech normal.        Behavior: Behavior normal.     ED Results / Procedures / Treatments   Labs (all labs ordered are listed, but only abnormal results are displayed) Labs Reviewed - No data to  display  EKG None  Radiology No results found.  Procedures Procedures    Medications Ordered in ED Medications  doxycycline (VIBRA-TABS) tablet 100 mg (has no administration in time range)    ED Course/ Medical Decision Making/ A&P                           Medical Decision Making Risk Prescription drug management.   Presents with either insect bite or sting or possibly small area of folliculitis.  Cannot differentiate the 2, we will treat with doxycycline.  She appears well otherwise.  No drainable fluid collections on exam.        Final Clinical Impression(s) / ED Diagnoses Final diagnoses:  Cellulitis of right lower extremity    Rx / DC Orders ED Discharge Orders          Ordered    doxycycline (VIBRAMYCIN) 100 MG capsule  2 times daily        06/24/22 0044              Gilda Crease, MD 06/24/22 [phone]

## 2023-07-02 ENCOUNTER — Encounter (HOSPITAL_COMMUNITY): Payer: Self-pay | Admitting: Emergency Medicine

## 2023-07-02 ENCOUNTER — Other Ambulatory Visit: Payer: Self-pay

## 2023-07-02 ENCOUNTER — Emergency Department (HOSPITAL_COMMUNITY)
Admission: EM | Admit: 2023-07-02 | Discharge: 2023-07-03 | Disposition: A | Discharge status: Home / Self Care | Payer: BC Managed Care – PPO | Attending: Emergency Medicine | Admitting: Emergency Medicine

## 2023-07-02 ENCOUNTER — Emergency Department (HOSPITAL_COMMUNITY): Payer: BC Managed Care – PPO

## 2023-07-02 DIAGNOSIS — M25511 Pain in right shoulder: Secondary | ICD-10-CM | POA: Insufficient documentation

## 2023-07-02 NOTE — ED Triage Notes (Signed)
Pt with c/o R shoulder pain x 6 weeks. States she has a job that requires repetitive movements of her right shoulder. States pain has progressively gotten worse.

## 2023-07-03 MED ORDER — METHOCARBAMOL 500 MG PO TABS: 500.0000 mg | ORAL_TABLET | Freq: Two times a day (BID) | ORAL | 0 refills | Status: AC

## 2023-07-03 MED ORDER — KETOROLAC TROMETHAMINE 30 MG/ML IJ SOLN
30.0000 mg | Freq: Once | INTRAMUSCULAR | Status: AC
  Administered 2023-07-03: 30 mg via INTRAMUSCULAR
  Filled 2023-07-03: qty 1

## 2023-07-03 MED ORDER — NAPROXEN 500 MG PO TABS: 500.0000 mg | ORAL_TABLET | Freq: Two times a day (BID) | ORAL | 0 refills | Status: AC

## 2023-07-03 NOTE — ED Provider Notes (Signed)
[place] EMERGENCY DEPARTMENT AT [facility]  Provider Note  CSN: [id number] Arrival date & time: 07/02/23 2317  History Chief Complaint  Patient presents with   Shoulder Pain    Lauren Reilly is a 27 y.o. female with remote history of R shoulder injury as a child reports 6 weeks of occasional R shoulder pain, worse with lifting overhead she does at work. Increased in severity at work last night and again tonight prompting her ED visit. No falls or injury. Pain radiates to forearm, no numbness.   Home Medications Prior to Admission medications   Medication Sig Start Date End Date Taking? Authorizing Provider  methocarbamol (ROBAXIN) 500 MG tablet Take 1 tablet (500 mg total) by mouth 2 (two) times daily. 07/03/23  Yes Pollyann Savoy, MD  naproxen (NAPROSYN) 500 MG tablet Take 1 tablet (500 mg total) by mouth 2 (two) times daily. 07/03/23  Yes Pollyann Savoy, MD  ARIPiprazole (ABILIFY) 10 MG tablet Take 1 tablet (10 mg total) by mouth daily. For mood control 09/22/17   Money, Gerlene Burdock, FNP  doxycycline (VIBRAMYCIN) 100 MG capsule Take 1 capsule (100 mg total) by mouth 2 (two) times daily. 06/24/22   Gilda Crease, MD  hydrOXYzine (ATARAX/VISTARIL) 25 MG tablet Take 1 tablet (25 mg total) by mouth every 6 (six) hours as needed for anxiety. 09/22/17   Money, Gerlene Burdock, FNP  ondansetron (ZOFRAN) 4 MG tablet Take 1 tablet (4 mg total) by mouth every 6 (six) hours. 10/31/18   Janne Napoleon, NP  traZODone (DESYREL) 50 MG tablet Take 1 tablet (50 mg total) by mouth at bedtime as needed for sleep. 09/22/17   Money, Gerlene Burdock, FNP     Allergies    Pollen extract   Review of Systems   Review of Systems Please see HPI for pertinent positives and negatives  Physical Exam BP (!) 136/94 (BP Location: Left Wrist)   Pulse 77   Temp 98.2 F (36.8 C) (Oral)   Resp 16   Ht 5\' 1"  (1.549 m)   Wt 98.2 kg   LMP 06/11/2023 (Approximate)   SpO2 100%   BMI 40.91 kg/m    Physical Exam Vitals and nursing note reviewed.  HENT:     Head: Normocephalic.     Nose: Nose normal.  Eyes:     Extraocular Movements: Extraocular movements intact.  Cardiovascular:     Pulses: Normal pulses.  Pulmonary:     Effort: Pulmonary effort is normal.  Musculoskeletal:        General: Tenderness present. No swelling or deformity. Normal range of motion.     Cervical back: Neck supple.     Comments: Tender over the R trapezius muscle  Skin:    Findings: No rash (on exposed skin).  Neurological:     Mental Status: She is alert and oriented to person, place, and time.     Cranial Nerves: No cranial nerve deficit.     Sensory: No sensory deficit.     Motor: No weakness.  Psychiatric:        Mood and Affect: Mood normal.     ED Results / Procedures / Treatments   EKG None  Procedures Procedures  Medications Ordered in the ED Medications  ketorolac (TORADOL) 30 MG/ML injection 30 mg (has no administration in time range)    Initial Impression and Plan  Patient here with MSK R shoulder pain. Could be trapezius spasm, I personally viewed the images from  radiology studies and agree with radiologist interpretation: Xray shows abnormality in glenoid, possibly related to prior injury, unclear if this is contributing to her pain or not. Will give IM toradol for pain. Sling for comfort with instructions to avoid frozen shoulder and Rx for Naprosyn/Robaxin and Ortho follow up.  ED Course       MDM Rules/Calculators/A&P Medical Decision Making Problems Addressed: Acute pain of right shoulder: acute illness or injury  Amount and/or Complexity of Data Reviewed Radiology: ordered and independent interpretation performed. Decision-making details documented in ED Course.  Risk Prescription drug management.     Final Clinical Impression(s) / ED Diagnoses Final diagnoses:  Acute pain of right shoulder    Rx / DC Orders ED Discharge Orders          Ordered     naproxen (NAPROSYN) 500 MG tablet  2 times daily        07/03/23 0222    methocarbamol (ROBAXIN) 500 MG tablet  2 times daily        07/03/23 0222             Pollyann Savoy, MD 07/03/23 Earle Gell

## 2023-11-13 ENCOUNTER — Encounter (HOSPITAL_COMMUNITY): Payer: Self-pay

## 2023-11-13 ENCOUNTER — Other Ambulatory Visit: Payer: Self-pay

## 2023-11-13 ENCOUNTER — Emergency Department (HOSPITAL_COMMUNITY)
Admission: EM | Admit: 2023-11-13 | Discharge: 2023-11-13 | Discharge status: Against Medical Advice | Payer: Self-pay | Attending: Emergency Medicine | Admitting: Emergency Medicine

## 2023-11-13 DIAGNOSIS — Z5321 Procedure and treatment not carried out due to patient leaving prior to being seen by health care provider: Secondary | ICD-10-CM | POA: Insufficient documentation

## 2023-11-13 DIAGNOSIS — Z20822 Contact with and (suspected) exposure to covid-19: Secondary | ICD-10-CM | POA: Insufficient documentation

## 2023-11-13 DIAGNOSIS — J101 Influenza due to other identified influenza virus with other respiratory manifestations: Secondary | ICD-10-CM | POA: Insufficient documentation

## 2023-11-13 LAB — RESP PANEL BY RT-PCR (RSV, FLU A&B, COVID)  RVPGX2
Influenza A by PCR: POSITIVE — AB
Influenza B by PCR: NEGATIVE
Resp Syncytial Virus by PCR: NEGATIVE
SARS Coronavirus 2 by RT PCR: NEGATIVE

## 2023-11-13 LAB — BASIC METABOLIC PANEL
Anion gap: 11 (ref 5–15)
BUN: 10 mg/dL (ref 6–20)
CO2: 21 mmol/L — ABNORMAL LOW (ref 22–32)
Calcium: 9.1 mg/dL (ref 8.9–10.3)
Chloride: 101 mmol/L (ref 98–111)
Creatinine, Ser: 0.8 mg/dL (ref 0.44–1.00)
GFR, Estimated: >60 mL/min (ref >60)
Glucose, Bld: 93 mg/dL (ref 70–99)
Potassium: 3.2 mmol/L — ABNORMAL LOW (ref 3.5–5.1)
Sodium: 133 mmol/L — ABNORMAL LOW (ref 135–145)

## 2023-11-13 LAB — CBC WITH DIFFERENTIAL/PLATELET
Abs Immature Granulocytes: 0.01 10*3/uL (ref 0.00–0.07)
Basophils Absolute: 0 10*3/uL (ref 0.0–0.1)
Basophils Relative: 0 %
Eosinophils Absolute: 0 10*3/uL (ref 0.0–0.5)
Eosinophils Relative: 0 %
HCT: 35.8 % — ABNORMAL LOW (ref 36.0–46.0)
Hemoglobin: 11.5 g/dL — ABNORMAL LOW (ref 12.0–15.0)
Immature Granulocytes: 0 %
Lymphocytes Relative: 11 %
Lymphs Abs: 0.6 10*3/uL — ABNORMAL LOW (ref 0.7–4.0)
MCH: 28.2 pg (ref 26.0–34.0)
MCHC: 32.1 g/dL (ref 30.0–36.0)
MCV: 87.7 fL (ref 80.0–100.0)
Monocytes Absolute: 0.7 10*3/uL (ref 0.1–1.0)
Monocytes Relative: 12 %
Neutro Abs: 4.3 10*3/uL (ref 1.7–7.7)
Neutrophils Relative %: 77 %
Platelets: 220 10*3/uL (ref 150–400)
RBC: 4.08 MIL/uL (ref 3.87–5.11)
RDW: 13 % (ref 11.5–15.5)
WBC: 5.6 10*3/uL (ref 4.0–10.5)
nRBC: 0 % (ref 0.0–0.2)

## 2023-11-13 NOTE — ED Notes (Signed)
Pt informed ER Staff she was going to leave and go home. Pt verbalized understanding the risks of leaving before being seen.

## 2023-11-13 NOTE — ED Triage Notes (Signed)
Pt arrived via POV c/o weakness, fatigue, tachypnea, cough and difficulty breathing.

## 2024-07-03 ENCOUNTER — Other Ambulatory Visit: Payer: Self-pay

## 2024-07-03 ENCOUNTER — Encounter (HOSPITAL_COMMUNITY): Payer: Self-pay

## 2024-07-03 ENCOUNTER — Emergency Department (HOSPITAL_COMMUNITY): Payer: Self-pay

## 2024-07-03 ENCOUNTER — Emergency Department (HOSPITAL_COMMUNITY)
Admission: EM | Admit: 2024-07-03 | Discharge: 2024-07-03 | Disposition: A | Discharge status: Home / Self Care | Payer: Self-pay

## 2024-07-03 DIAGNOSIS — S0083XA Contusion of other part of head, initial encounter: Secondary | ICD-10-CM | POA: Insufficient documentation

## 2024-07-03 DIAGNOSIS — S7002XA Contusion of left hip, initial encounter: Secondary | ICD-10-CM | POA: Diagnosis not present

## 2024-07-03 DIAGNOSIS — M62838 Other muscle spasm: Secondary | ICD-10-CM | POA: Diagnosis not present

## 2024-07-03 DIAGNOSIS — Y92410 Unspecified street and highway as the place of occurrence of the external cause: Secondary | ICD-10-CM | POA: Insufficient documentation

## 2024-07-03 DIAGNOSIS — R519 Headache, unspecified: Secondary | ICD-10-CM | POA: Diagnosis present
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
## 2024-07-03 NOTE — ED Notes (Signed)
 Ambulatory to tx room. EDP at bedside.

## 2024-07-03 NOTE — ED Notes (Signed)
 ED Provider at bedside.
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
## 2024-07-03 NOTE — ED Notes (Signed)
 Patient upset and talking loudly on phone. Unable to get updated vital signs.  Ambulatory to lobby

## 2024-07-03 NOTE — Discharge Instructions (Addendum)
 You can use ice, Tylenol  and Motrin  as needed for pain

## 2024-10-19 ENCOUNTER — Emergency Department (HOSPITAL_BASED_OUTPATIENT_CLINIC_OR_DEPARTMENT_OTHER)
Admission: EM | Admit: 2024-10-19 | Discharge: 2024-10-19 | Disposition: A | Discharge status: Home / Self Care | Payer: Self-pay | Attending: Emergency Medicine | Admitting: Emergency Medicine

## 2024-10-19 ENCOUNTER — Encounter (HOSPITAL_BASED_OUTPATIENT_CLINIC_OR_DEPARTMENT_OTHER): Payer: Self-pay

## 2024-10-19 ENCOUNTER — Other Ambulatory Visit: Payer: Self-pay

## 2024-10-19 DIAGNOSIS — R112 Nausea with vomiting, unspecified: Secondary | ICD-10-CM | POA: Insufficient documentation

## 2024-10-19 DIAGNOSIS — R1084 Generalized abdominal pain: Secondary | ICD-10-CM | POA: Insufficient documentation

## 2024-10-19 DIAGNOSIS — R197 Diarrhea, unspecified: Secondary | ICD-10-CM | POA: Insufficient documentation

## 2024-10-19 LAB — PREGNANCY, URINE: Preg Test, Ur: NEGATIVE

## 2024-10-19 LAB — CBC
HCT: 36.6 % (ref 36.0–46.0)
Hemoglobin: 12 g/dL (ref 12.0–15.0)
MCH: 29.3 pg (ref 26.0–34.0)
MCHC: 32.8 g/dL (ref 30.0–36.0)
MCV: 89.3 fL (ref 80.0–100.0)
Platelets: 274 K/uL (ref 150–400)
RBC: 4.1 MIL/uL (ref 3.87–5.11)
RDW: 12.9 % (ref 11.5–15.5)
WBC: 9 K/uL (ref 4.0–10.5)
nRBC: 0 % (ref 0.0–0.2)

## 2024-10-19 LAB — COMPREHENSIVE METABOLIC PANEL WITH GFR
ALT: 7 U/L (ref 0–44)
AST: 16 U/L (ref 15–41)
Albumin: 4.6 g/dL (ref 3.5–5.0)
Alkaline Phosphatase: 64 U/L (ref 38–126)
Anion gap: 11 (ref 5–15)
BUN: 14 mg/dL (ref 6–20)
CO2: 24 mmol/L (ref 22–32)
Calcium: 9.2 mg/dL (ref 8.9–10.3)
Chloride: 101 mmol/L (ref 98–111)
Creatinine, Ser: 0.71 mg/dL (ref 0.44–1.00)
GFR, Estimated: >60 mL/min
Glucose, Bld: 86 mg/dL (ref 70–99)
Potassium: 3.9 mmol/L (ref 3.5–5.1)
Sodium: 137 mmol/L (ref 135–145)
Total Bilirubin: 0.4 mg/dL (ref 0.0–1.2)
Total Protein: 7.5 g/dL (ref 6.5–8.1)

## 2024-10-19 LAB — URINALYSIS, ROUTINE W REFLEX MICROSCOPIC
Bilirubin Urine: NEGATIVE
Glucose, UA: NEGATIVE mg/dL
Hgb urine dipstick: NEGATIVE
Nitrite: NEGATIVE
Specific Gravity, Urine: 1.015 (ref 1.005–1.030)
pH: 7.5 (ref 5.0–8.0)

## 2024-10-19 LAB — LIPASE, BLOOD: Lipase: 13 U/L (ref 11–51)

## 2024-10-19 MED ORDER — LOPERAMIDE HCL 2 MG PO CAPS: 2.0000 mg | ORAL_CAPSULE | Freq: Four times a day (QID) | ORAL | 0 refills | Status: AC | PRN

## 2024-10-19 MED ORDER — SODIUM CHLORIDE 0.9 % IV BOLUS
1000.0000 mL | Freq: Once | INTRAVENOUS | Status: AC
  Administered 2024-10-19: 1000 mL via INTRAVENOUS

## 2024-10-19 MED ORDER — DICYCLOMINE HCL 20 MG PO TABS: 20.0000 mg | ORAL_TABLET | Freq: Two times a day (BID) | ORAL | 0 refills | Status: AC

## 2024-10-19 MED ORDER — ONDANSETRON 4 MG PO TBDP
4.0000 mg | ORAL_TABLET | Freq: Once | ORAL | Status: AC | PRN
  Administered 2024-10-19: 4 mg via ORAL
  Filled 2024-10-19: qty 1

## 2024-10-19 MED ORDER — ONDANSETRON 4 MG PO TBDP: 4.0000 mg | ORAL_TABLET | Freq: Three times a day (TID) | ORAL | 0 refills | Status: AC | PRN

## 2024-10-19 NOTE — ED Provider Notes (Signed)
 " [place] EMERGENCY DEPARTMENT AT [facility] Provider Note   CSN: [id number] Arrival date & time: 10/19/24  1825     Patient presents with: Abdominal Pain and Emesis   Lauren Reilly is a 29 y.o. female N/V/D after eating fast food.  Patient states yesterday ate at [employer] and her chicken was chewy and not the correct color.  She woke up this morning with generalized abdominal cramping, nausea, vomiting, diarrhea.  No sick contacts.  No recent antibiotics or travel.  No fever, chest pain, shortness of breath.  No blood in stool or emesis.  No dysuria or hematuria.  Denies chance of pregnancy.  When Zofran  in lobby prior to my assessment which improved her symptoms   HPI     Prior to Admission medications  Medication Sig Start Date End Date Taking? Authorizing Provider  dicyclomine  (BENTYL ) 20 MG tablet Take 1 tablet (20 mg total) by mouth 2 (two) times daily. 10/19/24  Yes Samaa Ueda A, PA-C  loperamide  (IMODIUM ) 2 MG capsule Take 1 capsule (2 mg total) by mouth 4 (four) times daily as needed for diarrhea or loose stools. 10/19/24  Yes Lasondra Hodgkins A, PA-C  ondansetron  (ZOFRAN -ODT) 4 MG disintegrating tablet Take 1 tablet (4 mg total) by mouth every 8 (eight) hours as needed. 10/19/24  Yes Keayra Graham A, PA-C  ARIPiprazole  (ABILIFY ) 10 MG tablet Take 1 tablet (10 mg total) by mouth daily. For mood control 09/22/17   Money, Caron NOVAK, FNP  doxycycline  (VIBRAMYCIN ) 100 MG capsule Take 1 capsule (100 mg total) by mouth 2 (two) times daily. 06/24/22   Haze Lonni PARAS, MD  hydrOXYzine  (ATARAX /VISTARIL ) 25 MG tablet Take 1 tablet (25 mg total) by mouth every 6 (six) hours as needed for anxiety. 09/22/17   Money, Caron NOVAK, FNP  methocarbamol  (ROBAXIN ) 500 MG tablet Take 1 tablet (500 mg total) by mouth 2 (two) times daily. 07/03/23   Roselyn Carlin NOVAK, MD  naproxen  (NAPROSYN ) 500 MG tablet Take 1 tablet (500 mg total) by mouth 2 (two) times daily. 07/03/23   Roselyn Carlin NOVAK, MD  ondansetron  (ZOFRAN ) 4 MG tablet Take 1 tablet (4 mg total) by mouth every 6 (six) hours. 10/31/18   Jamelle Lorrayne HERO, NP  traZODone  (DESYREL ) 50 MG tablet Take 1 tablet (50 mg total) by mouth at bedtime as needed for sleep. 09/22/17   Money, Caron NOVAK, FNP    Allergies: Bee pollen and Pollen extract    Review of Systems  Constitutional: Negative.   HENT: Negative.    Respiratory: Negative.    Cardiovascular: Negative.   Gastrointestinal:  Positive for abdominal pain, diarrhea, nausea and vomiting. Negative for abdominal distention, anal bleeding, blood in stool, constipation and rectal pain.  Genitourinary: Negative.   Musculoskeletal: Negative.   Neurological: Negative.   All other systems reviewed and are negative.   Updated Vital Signs BP 107/77 (BP Location: Right Arm)   Pulse 88   Temp 98.4 F (36.9 C) (Temporal)   Resp 16   SpO2 100%   Physical Exam Vitals and nursing note reviewed.  Constitutional:      General: She is not in acute distress.    Appearance: She is well-developed. She is not ill-appearing, toxic-appearing or diaphoretic.  HENT:     Head: Atraumatic.  Eyes:     Pupils: Pupils are equal, round, and reactive to light.  Cardiovascular:     Rate and Rhythm: Normal rate.     Heart sounds: Normal heart  sounds.  Pulmonary:     Effort: Pulmonary effort is normal. No respiratory distress.     Breath sounds: Normal breath sounds.  Abdominal:     General: Bowel sounds are normal. There is no distension.     Tenderness: There is generalized abdominal tenderness.  Musculoskeletal:        General: Normal range of motion.     Cervical back: Normal range of motion.  Skin:    General: Skin is warm and dry.     Capillary Refill: Capillary refill takes less than 2 seconds.  Neurological:     General: No focal deficit present.     Mental Status: She is alert.  Psychiatric:        Mood and Affect: Mood normal.     (all labs ordered are listed, but  only abnormal results are displayed) Labs Reviewed  URINALYSIS, ROUTINE W REFLEX MICROSCOPIC - Abnormal; Notable for the following components:      Result Value   Ketones, ur TRACE (*)    Protein, ur TRACE (*)    Leukocytes,Ua TRACE (*)    Bacteria, UA RARE (*)    All other components within normal limits  LIPASE, BLOOD  COMPREHENSIVE METABOLIC PANEL WITH GFR  CBC  PREGNANCY, URINE    EKG: None  Radiology: No results found.   Procedures   Medications Ordered in the ED  ondansetron  (ZOFRAN -ODT) disintegrating tablet 4 mg (4 mg Oral Given 10/19/24 1838)  sodium chloride  0.9 % bolus 1,000 mL (0 mLs Intravenous Stopped 10/19/24 7819)    29 year old here for evaluation abdominal pain, nausea vomiting diarrhea.  Symptoms started after eating fast food.  Ate the fast food yesterday, symptoms started this morning.  She is some generalized abdominal cramping however no focal pain.  No blood in emesis or stool.  She does not appear dehydrated on exam.  She received ODT Zofran  in triage which significantly improved her symptoms.  She does not anything for pain at this time.  Will give her IV fluids check labs  Labs personally viewed interpreted:  Pregnancy test negative UA negative for infection CBC without leukocytosis Metabolic panel without significant abnormality  lipase 13  Patient reassessed, benign abdominal exam.  Tolerating p.o. intake.  Suspect gastroenteritis.  Will have follow-up outpatient, return for any worsening symptoms  Patient is nontoxic, nonseptic appearing, in no apparent distress.  Patient's pain and other symptoms adequately managed in emergency department.  Fluid bolus given.  Labs, imaging and vitals reviewed.  Patient does not meet the SIRS or Sepsis criteria.  On repeat exam patient does not have a surgical abdomin and there are no peritoneal signs.  No indication of appendicitis, bowel obstruction, bowel perforation, cholecystitis, diverticulitis, PID,  intermittent, persistent torsion, TOA, ectopic pregnancy, AAA, dissection, traumatic injury, bacterial infectious diarrhea.  Patient discharged home with symptomatic treatment and given strict instructions for follow-up with their primary care physician.  I have also discussed reasons to return immediately to the ER.  Patient expresses understanding and agrees with plan.                                    Medical Decision Making Amount and/or Complexity of Data Reviewed External Data Reviewed: labs and notes. Labs: ordered. Decision-making details documented in ED Course.  Risk OTC drugs. Prescription drug management. Decision regarding hospitalization. Diagnosis or treatment significantly limited by social determinants of health.  Final diagnoses:  Nausea vomiting and diarrhea    ED Discharge Orders          Ordered    dicyclomine  (BENTYL ) 20 MG tablet  2 times daily        10/19/24 2121    ondansetron  (ZOFRAN -ODT) 4 MG disintegrating tablet  Every 8 hours PRN        10/19/24 2122    loperamide  (IMODIUM ) 2 MG capsule  4 times daily PRN        10/19/24 2122               Shevawn Langenberg A, PA-C 10/19/24 2308    Ruthe Cornet, DO 10/19/24 2311  "

## 2024-10-19 NOTE — ED Triage Notes (Signed)
 Patient reports abdominal pain, vomiting, and diarrhea. Unsure if it is food poisoning or infections. She said she last had [employer] and her chicken was questionable at best.

## 2024-10-19 NOTE — Discharge Instructions (Signed)
 It was a pleasure taking care of you here today.  You likely have gastroenteritis or food poisoning.  I have written you for a few medications to help with your symptoms  Make sure to follow-up outpatient, return for any worsening symptoms
